# Patient Record
Sex: Female | Born: 1980 | Race: White | Hispanic: No | Marital: Married | Smoking: Never smoker
Health system: Southern US, Community
[De-identification: ages and names within clinical notes are randomized; demographics above are authoritative.]

## PROBLEM LIST (undated history)

## (undated) ENCOUNTER — Inpatient Hospital Stay (HOSPITAL_COMMUNITY): Payer: Self-pay

## (undated) DIAGNOSIS — E282 Polycystic ovarian syndrome: Secondary | ICD-10-CM

## (undated) DIAGNOSIS — O139 Gestational [pregnancy-induced] hypertension without significant proteinuria, unspecified trimester: Secondary | ICD-10-CM

## (undated) DIAGNOSIS — T8130XA Disruption of wound, unspecified, initial encounter: Secondary | ICD-10-CM

---
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
## 2000-09-10 ENCOUNTER — Emergency Department (HOSPITAL_COMMUNITY): Admission: EM | Admit: 2000-09-10 | Discharge: 2000-09-11 | Payer: Self-pay | Admitting: Emergency Medicine

## 2000-09-10 ENCOUNTER — Encounter: Payer: Self-pay | Admitting: Emergency Medicine

## 2000-09-12 ENCOUNTER — Emergency Department (HOSPITAL_COMMUNITY): Admission: EM | Admit: 2000-09-12 | Discharge: 2000-09-12 | Payer: Self-pay | Admitting: Emergency Medicine

## 2000-11-01 ENCOUNTER — Other Ambulatory Visit: Admission: RE | Admit: 2000-11-01 | Discharge: 2000-11-01 | Payer: Self-pay | Admitting: Obstetrics and Gynecology

## 2001-11-13 ENCOUNTER — Other Ambulatory Visit: Admission: RE | Admit: 2001-11-13 | Discharge: 2001-11-13 | Payer: Self-pay | Admitting: Obstetrics and Gynecology

## 2002-11-18 ENCOUNTER — Other Ambulatory Visit: Admission: RE | Admit: 2002-11-18 | Discharge: 2002-11-18 | Payer: Self-pay | Admitting: Obstetrics and Gynecology

## 2003-12-21 ENCOUNTER — Other Ambulatory Visit: Admission: RE | Admit: 2003-12-21 | Discharge: 2003-12-21 | Payer: Self-pay | Admitting: Obstetrics and Gynecology

## 2004-11-17 ENCOUNTER — Other Ambulatory Visit: Admission: RE | Admit: 2004-11-17 | Discharge: 2004-11-17 | Payer: Self-pay | Admitting: Obstetrics and Gynecology

## 2006-01-05 ENCOUNTER — Other Ambulatory Visit: Admission: RE | Admit: 2006-01-05 | Discharge: 2006-01-05 | Payer: Self-pay | Admitting: Obstetrics and Gynecology

## 2009-08-14 ENCOUNTER — Ambulatory Visit (HOSPITAL_COMMUNITY): Admission: RE | Admit: 2009-08-14 | Discharge: 2009-08-14 | Payer: Self-pay | Admitting: Obstetrics and Gynecology

## 2010-07-11 ENCOUNTER — Encounter (INDEPENDENT_AMBULATORY_CARE_PROVIDER_SITE_OTHER): Payer: Self-pay | Admitting: Obstetrics and Gynecology

## 2010-07-11 ENCOUNTER — Inpatient Hospital Stay (HOSPITAL_COMMUNITY)
Admission: RE | Admit: 2010-07-11 | Discharge: 2010-07-13 | Payer: Self-pay | Source: Home / Self Care | Attending: Obstetrics and Gynecology | Admitting: Obstetrics and Gynecology

## 2010-08-04 ENCOUNTER — Inpatient Hospital Stay (HOSPITAL_COMMUNITY)
Admission: AD | Admit: 2010-08-04 | Discharge: 2010-08-04 | Payer: Self-pay | Source: Home / Self Care | Attending: Obstetrics | Admitting: Obstetrics

## 2010-10-17 LAB — WOUND CULTURE

## 2010-10-18 LAB — COMPREHENSIVE METABOLIC PANEL
ALT: 13 U/L (ref 0–35)
AST: 20 U/L (ref 0–37)
Albumin: 2.8 g/dL — ABNORMAL LOW (ref 3.5–5.2)
Alkaline Phosphatase: 121 U/L — ABNORMAL HIGH (ref 39–117)
Chloride: 104 mEq/L (ref 96–112)
GFR calc Af Amer: >60 mL/min (ref >60)
Potassium: 4.1 mEq/L (ref 3.5–5.1)
Sodium: 133 mEq/L — ABNORMAL LOW (ref 135–145)
Total Bilirubin: 0.4 mg/dL (ref 0.3–1.2)

## 2010-10-18 LAB — CBC
HCT: 31.3 % — ABNORMAL LOW (ref 36.0–46.0)
HCT: 39.9 % (ref 36.0–46.0)
Hemoglobin: 10.9 g/dL — ABNORMAL LOW (ref 12.0–15.0)
Hemoglobin: 13.7 g/dL (ref 12.0–15.0)
MCH: 33 pg (ref 26.0–34.0)
MCHC: 34.4 g/dL (ref 30.0–36.0)
MCHC: 34.8 g/dL (ref 30.0–36.0)
MCV: 96 fL (ref 78.0–100.0)
MCV: 97 fL (ref 78.0–100.0)
Platelets: 282 10*3/uL (ref 150–400)
RBC: 4.15 MIL/uL (ref 3.87–5.11)
RDW: 13.3 % (ref 11.5–15.5)
RDW: 14.3 % (ref 11.5–15.5)
WBC: 12.2 10*3/uL — ABNORMAL HIGH (ref 4.0–10.5)

## 2010-10-18 LAB — RPR: RPR Ser Ql: NONREACTIVE

## 2011-07-27 LAB — OB RESULTS CONSOLE RPR: RPR: NONREACTIVE

## 2011-07-27 LAB — OB RESULTS CONSOLE HIV ANTIBODY (ROUTINE TESTING): HIV: NONREACTIVE

## 2011-07-27 LAB — OB RESULTS CONSOLE ABO/RH: RH Type: POSITIVE

## 2011-10-15 ENCOUNTER — Encounter (HOSPITAL_COMMUNITY): Payer: Self-pay | Admitting: *Deleted

## 2011-10-15 ENCOUNTER — Inpatient Hospital Stay (HOSPITAL_COMMUNITY)
Admission: AD | Admit: 2011-10-15 | Discharge: 2011-10-15 | Disposition: A | Discharge status: Home Health | Payer: Commercial Managed Care - PPO | Source: Ambulatory Visit | Attending: Obstetrics & Gynecology | Admitting: Obstetrics & Gynecology

## 2011-10-15 DIAGNOSIS — J069 Acute upper respiratory infection, unspecified: Secondary | ICD-10-CM | POA: Insufficient documentation

## 2011-10-15 DIAGNOSIS — R059 Cough, unspecified: Secondary | ICD-10-CM | POA: Insufficient documentation

## 2011-10-15 DIAGNOSIS — R05 Cough: Secondary | ICD-10-CM | POA: Insufficient documentation

## 2011-10-15 DIAGNOSIS — O99891 Other specified diseases and conditions complicating pregnancy: Secondary | ICD-10-CM | POA: Insufficient documentation

## 2011-10-15 HISTORY — DX: Gestational (pregnancy-induced) hypertension without significant proteinuria, unspecified trimester: O13.9

## 2011-10-15 HISTORY — DX: Disruption of wound, unspecified, initial encounter: T81.30XA

## 2011-10-15 HISTORY — DX: Polycystic ovarian syndrome: E28.2

## 2011-10-15 LAB — URINALYSIS, ROUTINE W REFLEX MICROSCOPIC
Glucose, UA: NEGATIVE mg/dL
Hgb urine dipstick: NEGATIVE
Ketones, ur: NEGATIVE mg/dL
Protein, ur: NEGATIVE mg/dL
pH: 6 (ref 5.0–8.0)

## 2011-10-15 LAB — CBC
HCT: 35.2 % — ABNORMAL LOW (ref 36.0–46.0)
Hemoglobin: 12 g/dL (ref 12.0–15.0)
MCHC: 34.1 g/dL (ref 30.0–36.0)
RBC: 3.76 MIL/uL — ABNORMAL LOW (ref 3.87–5.11)

## 2011-10-15 LAB — URINE MICROSCOPIC-ADD ON

## 2011-10-15 LAB — RAPID STREP SCREEN (MED CTR MEBANE ONLY): Streptococcus, Group A Screen (Direct): NEGATIVE

## 2011-10-15 NOTE — Discharge Instructions (Signed)

## 2011-10-15 NOTE — ED Provider Notes (Signed)
History  Jessee Avers 31 y.o. G2P1001 at 31 wks  HPI:  URTI Sx with congestion, dry cough and feverish sensation x yesterday.  Husband and 1 yo son got a viral infection which just resolved, similar Sx.  FMs pos, no UC, no vag bleed, no fluid leak.      Chief Complaint  Patient presents with  . Fever  . Cough    non-productive     Past Medical History  Diagnosis Date  . Pregnancy induced hypertension   . Wound dehiscence   . PCOS (polycystic ovarian syndrome)     Past Surgical History  Procedure Date  . Cesarean section     History reviewed. No pertinent family history.  History  Substance Use Topics  . Smoking status: Never Smoker   . Smokeless tobacco: Never Used  . Alcohol Use: No    Allergies: No Known Allergies  Prescriptions prior to admission  Medication Sig Dispense Refill  . butalbital-acetaminophen-caffeine (FIORICET, ESGIC) 50-325-40 MG per tablet Take 1-2 tablets by mouth every 6 (six) hours as needed. Patient can take 1-2 tablets every 4-6 hrs PRN with a max dose of 6 tablets per 24 hour period      . metFORMIN (GLUCOPHAGE-XR) 750 MG 24 hr tablet Take 750 mg by mouth daily with breakfast.      . Prenatal Vit-Fe Fumarate-FA (PRENATAL MULTIVITAMIN) TABS Take 1 tablet by mouth daily.        Physical Exam   Blood pressure 113/54, pulse 122, temperature 98.7 F (37.1 C), temperature source Oral, resp. rate 22, height 5\' 7"  (1.702 m), SpO2 99.00%. Lungs clear bilat. Ut soft non-tender FHR 130's, no deceleration No UC   ED Course  Pending Rapid Strep Throat test and U/A  A/P  31 wks with viral URTI.  R/O Strep throat, rapid test pending.  Genia Del MD  10/15/11 at 4:25 pm

## 2012-01-09 ENCOUNTER — Other Ambulatory Visit: Payer: Self-pay | Admitting: Obstetrics and Gynecology

## 2012-01-11 ENCOUNTER — Encounter (HOSPITAL_COMMUNITY): Payer: Self-pay | Admitting: Pharmacist

## 2012-01-25 ENCOUNTER — Encounter (HOSPITAL_COMMUNITY): Payer: Self-pay

## 2012-01-25 ENCOUNTER — Encounter (HOSPITAL_COMMUNITY)
Admission: RE | Admit: 2012-01-25 | Discharge: 2012-01-25 | Disposition: A | Discharge status: Home / Self Care | Payer: Commercial Managed Care - PPO | Source: Ambulatory Visit | Attending: Obstetrics and Gynecology | Admitting: Obstetrics and Gynecology

## 2012-01-25 LAB — CBC
HCT: 34.4 % — ABNORMAL LOW (ref 36.0–46.0)
MCHC: 33.7 g/dL (ref 30.0–36.0)
Platelets: 244 10*3/uL (ref 150–400)
RDW: 13.4 % (ref 11.5–15.5)
WBC: 10.7 10*3/uL — ABNORMAL HIGH (ref 4.0–10.5)

## 2012-01-25 LAB — SURGICAL PCR SCREEN
MRSA, PCR: NEGATIVE
Staphylococcus aureus: NEGATIVE

## 2012-01-25 MED ORDER — CEFAZOLIN SODIUM-DEXTROSE 2-3 GM-% IV SOLR
2.0000 g | INTRAVENOUS | Status: AC
  Administered 2012-01-26: 2 g via INTRAVENOUS
  Filled 2012-01-25: qty 50

## 2012-01-25 NOTE — Pre-Procedure Instructions (Signed)
Shannon from lab called me to say Lab is rejecting tube for T&S drawn today with preop labs.Marland KitchenMarland KitchenMarland KitchenKatie didn't put arm band # on the tube and will need to be re-drawn on DOS.

## 2012-01-25 NOTE — Patient Instructions (Addendum)
YOUR PROCEDURE IS SCHEDULED ON:01/26/12  ENTER THROUGH THE MAIN ENTRANCE OF [facility] AT: 10 am  USE DESK PHONE AND DIAL [phone] TO INFORM us OF YOUR ARRIVAL  CALL [phone] IF YOU HAVE ANY QUESTIONS OR PROBLEMS PRIOR TO YOUR ARRIVAL.  REMEMBER: DO NOT EAT AFTER MIDNIGHT : tonight  SPECIAL INSTRUCTIONS: water ok through the night and up until 730 am on Friday   YOU MAY BRUSH YOUR TEETH THE MORNING OF SURGERY   TAKE THESE MEDICINES THE DAY OF SURGERY WITH SIP OF WATER:none....hold Metformin tonight   DO NOT WEAR JEWELRY, EYE MAKEUP, LIPSTICK OR DARK FINGERNAIL POLISH  DO NOT WEAR LOTIONS   DO NOT SHAVE FOR 48 HOURS PRIOR TO SURGERY

## 2012-01-25 NOTE — H&P (Signed)
Shannon Estrada, Shannon Estrada NO.:  [id number]  MEDICAL RECORD NO.:  [id number]  LOCATION:  PERIO                         FACILITY:  [facility]  PHYSICIAN:  Lenoard Aden, M.D.DATE OF BIRTH:  [birth year]  DATE OF ADMISSION:  12/28/2011 DATE OF DISCHARGE:                             HISTORY & PHYSICAL   HOSPITAL SURGERY:  January 26, 2012.  CHIEF COMPLAINT:  Repeat C-section.  HISTORY OF PRESENT ILLNESS:  She is a 31 year old white female, G2, P1, at [redacted] weeks gestation presents for repeat low-segment transverse cesarean section.  ALLERGIES:  None.  MEDICATIONS:  Fioricet, metformin, and prenatal vitamins.  MEDICAL HISTORY:  Remarkable for chronic hypertension, on medications. Family history of hypertension, heart disease, diabetes, previous C- section in 2011, 9 pounds 4 ounce female.  Prenatal course is uncomplicated.  PHYSICAL EXAMINATION:  GENERAL:  A well-developed well-nourished white female, in no acute distress. HEENT:  Normal. NECK:  Supple.  Full range of motion. LUNGS:  Clear. HEART:  Regular rate and rhythm. ABDOMEN:  Soft, gravid, nontender.  Estimated fetal weight 8 to 9 pounds.  Cervix is closed, 20%, vertex, -2. EXTREMITIES:  There are no cords. NEUROLOGIC:  Nonfocal. SKIN:  Intact.  IMPRESSION:  Term intrauterine pregnancy with previous C-section, for elective repeat.  PLAN:  Proceed with repeat C-section.  Risks of anesthesia, infection, bleeding, injury to abdominal organs, need for repair discussed. Delayed versus immediate complications to include bowel and bladder injury noted.  The patient acknowledges and wishes to proceed.    Lenoard Aden, M.D.    RJT/MEDQ  D:  01/25/2012  T:  01/25/2012  Job:  [id number]

## 2012-01-26 ENCOUNTER — Encounter (HOSPITAL_COMMUNITY): Payer: Self-pay | Admitting: Anesthesiology

## 2012-01-26 ENCOUNTER — Inpatient Hospital Stay (HOSPITAL_COMMUNITY)
Admission: RE | Admit: 2012-01-26 | Discharge: 2012-01-28 | DRG: 765 | Disposition: A | Discharge status: Home / Self Care | Payer: Commercial Managed Care - PPO | Source: Ambulatory Visit | Attending: Obstetrics and Gynecology | Admitting: Obstetrics and Gynecology

## 2012-01-26 ENCOUNTER — Encounter (HOSPITAL_COMMUNITY): Payer: Self-pay | Admitting: General Surgery

## 2012-01-26 ENCOUNTER — Encounter (HOSPITAL_COMMUNITY): Payer: Self-pay

## 2012-01-26 ENCOUNTER — Inpatient Hospital Stay (HOSPITAL_COMMUNITY): Payer: Commercial Managed Care - PPO

## 2012-01-26 ENCOUNTER — Encounter (HOSPITAL_COMMUNITY)
Admission: RE | Disposition: A | Discharge status: Home / Self Care | Payer: Self-pay | Source: Ambulatory Visit | Attending: Obstetrics and Gynecology

## 2012-01-26 DIAGNOSIS — O3660X0 Maternal care for excessive fetal growth, unspecified trimester, not applicable or unspecified: Secondary | ICD-10-CM | POA: Diagnosis present

## 2012-01-26 DIAGNOSIS — O34599 Maternal care for other abnormalities of gravid uterus, unspecified trimester: Secondary | ICD-10-CM | POA: Diagnosis present

## 2012-01-26 DIAGNOSIS — Z01812 Encounter for preprocedural laboratory examination: Secondary | ICD-10-CM

## 2012-01-26 DIAGNOSIS — E282 Polycystic ovarian syndrome: Secondary | ICD-10-CM | POA: Diagnosis present

## 2012-01-26 DIAGNOSIS — Z01818 Encounter for other preprocedural examination: Secondary | ICD-10-CM

## 2012-01-26 DIAGNOSIS — O34219 Maternal care for unspecified type scar from previous cesarean delivery: Principal | ICD-10-CM | POA: Diagnosis present

## 2012-01-26 DIAGNOSIS — O1002 Pre-existing essential hypertension complicating childbirth: Secondary | ICD-10-CM | POA: Diagnosis present

## 2012-01-26 LAB — TYPE AND SCREEN: Antibody Screen: NEGATIVE

## 2012-01-26 LAB — RPR: RPR Ser Ql: NONREACTIVE

## 2012-01-26 SURGERY — Surgical Case
Anesthesia: Spinal | Site: Abdomen | Wound class: Clean Contaminated

## 2012-01-26 MED ORDER — PRENATAL MULTIVITAMIN CH
1.0000 | ORAL_TABLET | Freq: Every day | ORAL | Status: DC
  Administered 2012-01-27: 1 via ORAL
  Filled 2012-01-26: qty 1

## 2012-01-26 MED ORDER — DIBUCAINE 1 % RE OINT: 1.0000 "application " | TOPICAL_OINTMENT | RECTAL | Status: DC | PRN

## 2012-01-26 MED ORDER — FENTANYL CITRATE 0.05 MG/ML IJ SOLN
INTRAMUSCULAR | Status: DC | PRN
  Administered 2012-01-26: 25 ug via INTRATHECAL

## 2012-01-26 MED ORDER — PHENYLEPHRINE 40 MCG/ML (10ML) SYRINGE FOR IV PUSH (FOR BLOOD PRESSURE SUPPORT)
PREFILLED_SYRINGE | INTRAVENOUS | Status: AC
  Filled 2012-01-26: qty 5

## 2012-01-26 MED ORDER — FENTANYL CITRATE 0.05 MG/ML IJ SOLN
INTRAMUSCULAR | Status: AC
  Filled 2012-01-26: qty 2

## 2012-01-26 MED ORDER — WITCH HAZEL-GLYCERIN EX PADS: 1.0000 "application " | MEDICATED_PAD | CUTANEOUS | Status: DC | PRN

## 2012-01-26 MED ORDER — ONDANSETRON HCL 4 MG/2ML IJ SOLN: 4.0000 mg | Freq: Three times a day (TID) | INTRAMUSCULAR | Status: DC | PRN

## 2012-01-26 MED ORDER — NALOXONE HCL 0.4 MG/ML IJ SOLN
1.0000 ug/kg/h | INTRAMUSCULAR | Status: DC | PRN
  Filled 2012-01-26: qty 2.5

## 2012-01-26 MED ORDER — OXYCODONE-ACETAMINOPHEN 5-325 MG PO TABS
1.0000 | ORAL_TABLET | ORAL | Status: DC | PRN
  Administered 2012-01-28: 1 via ORAL
  Filled 2012-01-26: qty 1

## 2012-01-26 MED ORDER — METHYLERGONOVINE MALEATE 0.2 MG/ML IJ SOLN: 0.2000 mg | INTRAMUSCULAR | Status: DC | PRN

## 2012-01-26 MED ORDER — NALBUPHINE HCL 10 MG/ML IJ SOLN
5.0000 mg | INTRAMUSCULAR | Status: DC | PRN
  Filled 2012-01-26: qty 1

## 2012-01-26 MED ORDER — LANOLIN HYDROUS EX OINT: 1.0000 "application " | TOPICAL_OINTMENT | CUTANEOUS | Status: DC | PRN

## 2012-01-26 MED ORDER — LACTATED RINGERS IV SOLN
INTRAVENOUS | Status: DC
  Administered 2012-01-26: 20:00:00 via INTRAVENOUS

## 2012-01-26 MED ORDER — METFORMIN HCL ER 750 MG PO TB24
750.0000 mg | ORAL_TABLET | Freq: Every day | ORAL | Status: DC
  Administered 2012-01-27: 750 mg via ORAL
  Filled 2012-01-26 (×2): qty 1

## 2012-01-26 MED ORDER — EPHEDRINE 5 MG/ML INJ
INTRAVENOUS | Status: AC
  Filled 2012-01-26: qty 10

## 2012-01-26 MED ORDER — MORPHINE SULFATE 0.5 MG/ML IJ SOLN
INTRAMUSCULAR | Status: AC
  Filled 2012-01-26: qty 10

## 2012-01-26 MED ORDER — IBUPROFEN 600 MG PO TABS
600.0000 mg | ORAL_TABLET | Freq: Four times a day (QID) | ORAL | Status: DC
  Administered 2012-01-26 – 2012-01-28 (×6): 600 mg via ORAL
  Filled 2012-01-26: qty 1

## 2012-01-26 MED ORDER — ONDANSETRON HCL 4 MG/2ML IJ SOLN
INTRAMUSCULAR | Status: DC | PRN
  Administered 2012-01-26: 4 mg via INTRAVENOUS

## 2012-01-26 MED ORDER — SIMETHICONE 80 MG PO CHEW
80.0000 mg | CHEWABLE_TABLET | ORAL | Status: DC | PRN
  Administered 2012-01-27: 80 mg via ORAL

## 2012-01-26 MED ORDER — SCOPOLAMINE 1 MG/3DAYS TD PT72: 1.0000 | MEDICATED_PATCH | Freq: Once | TRANSDERMAL | Status: DC

## 2012-01-26 MED ORDER — METOCLOPRAMIDE HCL 5 MG/ML IJ SOLN: 10.0000 mg | Freq: Three times a day (TID) | INTRAMUSCULAR | Status: DC | PRN

## 2012-01-26 MED ORDER — NALOXONE HCL 0.4 MG/ML IJ SOLN: 0.4000 mg | INTRAMUSCULAR | Status: DC | PRN

## 2012-01-26 MED ORDER — OXYTOCIN 10 UNIT/ML IJ SOLN
INTRAMUSCULAR | Status: AC
  Filled 2012-01-26: qty 4

## 2012-01-26 MED ORDER — MORPHINE SULFATE (PF) 0.5 MG/ML IJ SOLN
INTRAMUSCULAR | Status: DC | PRN
  Administered 2012-01-26: .15 mg via INTRATHECAL

## 2012-01-26 MED ORDER — MEPERIDINE HCL 25 MG/ML IJ SOLN: 6.2500 mg | INTRAMUSCULAR | Status: DC | PRN

## 2012-01-26 MED ORDER — ONDANSETRON HCL 4 MG/2ML IJ SOLN
INTRAMUSCULAR | Status: AC
  Filled 2012-01-26: qty 2

## 2012-01-26 MED ORDER — LACTATED RINGERS IV SOLN
INTRAVENOUS | Status: DC
  Administered 2012-01-26 (×4): via INTRAVENOUS

## 2012-01-26 MED ORDER — PHENYLEPHRINE HCL 10 MG/ML IJ SOLN
INTRAMUSCULAR | Status: DC | PRN
  Administered 2012-01-26: 40 ug via INTRAVENOUS
  Administered 2012-01-26: 80 ug via INTRAVENOUS
  Administered 2012-01-26: 40 ug via INTRAVENOUS
  Administered 2012-01-26: 80 ug via INTRAVENOUS

## 2012-01-26 MED ORDER — IBUPROFEN 600 MG PO TABS
600.0000 mg | ORAL_TABLET | Freq: Four times a day (QID) | ORAL | Status: DC | PRN
  Filled 2012-01-26 (×5): qty 1

## 2012-01-26 MED ORDER — SCOPOLAMINE 1 MG/3DAYS TD PT72
MEDICATED_PATCH | TRANSDERMAL | Status: AC
  Administered 2012-01-26: 1.5 mg via TRANSDERMAL
  Filled 2012-01-26: qty 1

## 2012-01-26 MED ORDER — OXYTOCIN 10 UNIT/ML IJ SOLN
40.0000 [IU] | INTRAVENOUS | Status: DC | PRN
  Administered 2012-01-26: 40 [IU] via INTRAVENOUS

## 2012-01-26 MED ORDER — TETANUS-DIPHTH-ACELL PERTUSSIS 5-2.5-18.5 LF-MCG/0.5 IM SUSP: 0.5000 mL | Freq: Once | INTRAMUSCULAR | Status: DC

## 2012-01-26 MED ORDER — BUPIVACAINE HCL (PF) 0.25 % IJ SOLN
INTRAMUSCULAR | Status: AC
  Filled 2012-01-26: qty 30

## 2012-01-26 MED ORDER — KETOROLAC TROMETHAMINE 30 MG/ML IJ SOLN: 30.0000 mg | Freq: Four times a day (QID) | INTRAMUSCULAR | Status: DC | PRN

## 2012-01-26 MED ORDER — DIPHENHYDRAMINE HCL 25 MG PO CAPS
25.0000 mg | ORAL_CAPSULE | ORAL | Status: DC | PRN
  Filled 2012-01-26: qty 1

## 2012-01-26 MED ORDER — DIPHENHYDRAMINE HCL 25 MG PO CAPS: 25.0000 mg | ORAL_CAPSULE | Freq: Four times a day (QID) | ORAL | Status: DC | PRN

## 2012-01-26 MED ORDER — ONDANSETRON HCL 4 MG PO TABS: 4.0000 mg | ORAL_TABLET | ORAL | Status: DC | PRN

## 2012-01-26 MED ORDER — ZOLPIDEM TARTRATE 5 MG PO TABS: 5.0000 mg | ORAL_TABLET | Freq: Every evening | ORAL | Status: DC | PRN

## 2012-01-26 MED ORDER — KETOROLAC TROMETHAMINE 60 MG/2ML IM SOLN
60.0000 mg | Freq: Once | INTRAMUSCULAR | Status: AC | PRN
  Administered 2012-01-26: 60 mg via INTRAMUSCULAR

## 2012-01-26 MED ORDER — DIPHENHYDRAMINE HCL 50 MG/ML IJ SOLN: 25.0000 mg | INTRAMUSCULAR | Status: DC | PRN

## 2012-01-26 MED ORDER — EPHEDRINE SULFATE 50 MG/ML IJ SOLN
INTRAMUSCULAR | Status: DC | PRN
  Administered 2012-01-26 (×5): 10 mg via INTRAVENOUS

## 2012-01-26 MED ORDER — SIMETHICONE 80 MG PO CHEW
80.0000 mg | CHEWABLE_TABLET | Freq: Three times a day (TID) | ORAL | Status: DC
  Administered 2012-01-26 – 2012-01-28 (×5): 80 mg via ORAL

## 2012-01-26 MED ORDER — KETOROLAC TROMETHAMINE 60 MG/2ML IM SOLN
INTRAMUSCULAR | Status: AC
  Administered 2012-01-26: 60 mg via INTRAMUSCULAR
  Filled 2012-01-26: qty 2

## 2012-01-26 MED ORDER — FENTANYL CITRATE 0.05 MG/ML IJ SOLN: 25.0000 ug | INTRAMUSCULAR | Status: DC | PRN

## 2012-01-26 MED ORDER — MENTHOL 3 MG MT LOZG: 1.0000 | LOZENGE | OROMUCOSAL | Status: DC | PRN

## 2012-01-26 MED ORDER — ONDANSETRON HCL 4 MG/2ML IJ SOLN: 4.0000 mg | INTRAMUSCULAR | Status: DC | PRN

## 2012-01-26 MED ORDER — SODIUM CHLORIDE 0.9 % IJ SOLN: 3.0000 mL | INTRAMUSCULAR | Status: DC | PRN

## 2012-01-26 MED ORDER — SENNOSIDES-DOCUSATE SODIUM 8.6-50 MG PO TABS
2.0000 | ORAL_TABLET | Freq: Every day | ORAL | Status: DC
  Administered 2012-01-26 – 2012-01-27 (×2): 2 via ORAL

## 2012-01-26 MED ORDER — DIPHENHYDRAMINE HCL 50 MG/ML IJ SOLN: 12.5000 mg | INTRAMUSCULAR | Status: DC | PRN

## 2012-01-26 MED ORDER — SCOPOLAMINE 1 MG/3DAYS TD PT72
1.0000 | MEDICATED_PATCH | Freq: Once | TRANSDERMAL | Status: DC
  Administered 2012-01-26: 1.5 mg via TRANSDERMAL

## 2012-01-26 MED ORDER — METHYLERGONOVINE MALEATE 0.2 MG PO TABS: 0.2000 mg | ORAL_TABLET | ORAL | Status: DC | PRN

## 2012-01-26 MED ORDER — BUPIVACAINE HCL (PF) 0.25 % IJ SOLN
INTRAMUSCULAR | Status: DC | PRN
  Administered 2012-01-26: 10 mL

## 2012-01-26 MED ORDER — OXYTOCIN 40 UNITS IN LACTATED RINGERS INFUSION - SIMPLE MED: 62.5000 mL/h | INTRAVENOUS | Status: AC

## 2012-01-26 SURGICAL SUPPLY — 23 items
CLOTH BEACON ORANGE TIMEOUT ST (SAFETY) ×2 IMPLANT
DRESSING TELFA 8X3 (GAUZE/BANDAGES/DRESSINGS) ×2 IMPLANT
ELECT REM PT RETURN 9FT ADLT (ELECTROSURGICAL) ×2
ELECTRODE REM PT RTRN 9FT ADLT (ELECTROSURGICAL) ×1 IMPLANT
EXTRACTOR VACUUM M CUP 4 TUBE (SUCTIONS) ×2 IMPLANT
GAUZE SPONGE 4X4 12PLY STRL LF (GAUZE/BANDAGES/DRESSINGS) ×4 IMPLANT
GLOVE BIO SURGEON STRL SZ7.5 (GLOVE) ×4 IMPLANT
GOWN PREVENTION PLUS LG XLONG (DISPOSABLE) ×4 IMPLANT
GOWN PREVENTION PLUS XLARGE (GOWN DISPOSABLE) ×2 IMPLANT
NEEDLE HYPO 25X1 1.5 SAFETY (NEEDLE) ×2 IMPLANT
NS IRRIG 1000ML POUR BTL (IV SOLUTION) ×2 IMPLANT
PACK C SECTION WH (CUSTOM PROCEDURE TRAY) ×2 IMPLANT
PAD ABD 7.5X8 STRL (GAUZE/BANDAGES/DRESSINGS) ×2 IMPLANT
STAPLER VISISTAT 35W (STAPLE) ×2 IMPLANT
SUT MNCRL 0 VIOLET CTX 36 (SUTURE) ×2 IMPLANT
SUT MON AB 2-0 CT1 27 (SUTURE) ×2 IMPLANT
SUT MON AB-0 CT1 36 (SUTURE) ×4 IMPLANT
SUT MONOCRYL 0 CTX 36 (SUTURE) ×2
SUT PLAIN 2 0 XLH (SUTURE) ×2 IMPLANT
SYR CONTROL 10ML LL (SYRINGE) ×2 IMPLANT
TAPE CLOTH SURG 4X10 WHT LF (GAUZE/BANDAGES/DRESSINGS) ×2 IMPLANT
TOWEL OR 17X24 6PK STRL BLUE (TOWEL DISPOSABLE) ×4 IMPLANT
TRAY FOLEY CATH 14FR (SET/KITS/TRAYS/PACK) ×2 IMPLANT

## 2012-01-26 NOTE — Progress Notes (Signed)
Patient ID: Shannon Estrada, female   DOB: 02-18-1981, 31 y.o.   MRN: [id number] Patient seen and examined. Consent witnessed and signed. No changes noted. Update completed.

## 2012-01-26 NOTE — Transfer of Care (Signed)
Immediate Anesthesia Transfer of Care Note  Patient: Shannon Estrada  Procedure(s) Performed: Procedure(s) (LRB): CESAREAN SECTION (N/A)  Patient Location: PACU  Anesthesia Type: Spinal  Level of Consciousness: awake, alert  and patient cooperative  Airway & Oxygen Therapy: Patient Spontanous Breathing  Post-op Assessment: Report given to PACU RN  Post vital signs: Reviewed  Complications: No apparent anesthesia complications

## 2012-01-26 NOTE — Anesthesia Procedure Notes (Signed)
Spinal  Patient location during procedure: OR Preanesthetic Checklist Completed: patient identified, site marked, surgical consent, pre-op evaluation, timeout performed, IV checked, risks and benefits discussed and monitors and equipment checked Spinal Block Patient position: sitting Prep: DuraPrep Patient monitoring: heart rate, cardiac monitor, continuous pulse ox and blood pressure Approach: midline Location: L3-4 Injection technique: single-shot Needle Needle type: Sprotte  Needle gauge: 24 G Needle length: 9 cm Assessment Sensory level: T4 Additional Notes Spinal Dosage in OR  Bupivicaine ml       1.7 PFMS04   mcg        150 Fentanyl mcg            25    

## 2012-01-26 NOTE — Preoperative (Signed)
Beta Blockers   Reason not to administer Beta Blockers:Not Applicable 

## 2012-01-26 NOTE — Anesthesia Postprocedure Evaluation (Signed)
  Anesthesia Post-op Note  Patient: Shannon Estrada  Procedure(s) Performed: Procedure(s) (LRB): CESAREAN SECTION (N/A)  Patient is awake, responsive, moving her legs, and has signs of resolution of her numbness. Pain and nausea are reasonably well controlled. Vital signs are stable and clinically acceptable. Oxygen saturation is clinically acceptable. There are no apparent anesthetic complications at this time. Patient is ready for discharge.

## 2012-01-26 NOTE — Anesthesia Preprocedure Evaluation (Signed)
Anesthesia Evaluation  Patient identified by MRN, date of birth, ID band Patient awake    Reviewed: Allergy & Precautions, H&P , NPO status , Patient's Chart, lab work & pertinent test results, reviewed documented beta blocker date and time   History of Anesthesia Complications Negative for: history of anesthetic complications  Airway Mallampati: I TM Distance: >3 FB Neck ROM: full    Dental  (+) Teeth Intact   Pulmonary neg pulmonary ROS,  breath sounds clear to auscultation        Cardiovascular negative cardio ROS  Rhythm:regular Rate:Normal     Neuro/Psych negative neurological ROS  negative psych ROS   GI/Hepatic negative GI ROS, Neg liver ROS,   Endo/Other  Morbid obesityPCOS on metformin  Renal/GU negative Renal ROS  negative genitourinary   Musculoskeletal   Abdominal   Peds  Hematology negative hematology ROS (+)   Anesthesia Other Findings   Reproductive/Obstetrics (+) Pregnancy (h/o c/s x1, repeat today)                           Anesthesia Physical Anesthesia Plan  ASA: III  Anesthesia Plan: Spinal   Post-op Pain Management:    Induction:   Airway Management Planned:   Additional Equipment:   Intra-op Plan:   Post-operative Plan:   Informed Consent: I have reviewed the patients History and Physical, chart, labs and discussed the procedure including the risks, benefits and alternatives for the proposed anesthesia with the patient or authorized representative who has indicated his/her understanding and acceptance.     Plan Discussed with: Surgeon and CRNA  Anesthesia Plan Comments:         Anesthesia Quick Evaluation

## 2012-01-26 NOTE — Op Note (Signed)
Cesarean Section Procedure Note  Indications: previous uterine incision kerr x one  Pre-operative Diagnosis: 39 week 0 day pregnancy.  Post-operative Diagnosis: same  Surgeon: Lenoard Aden   Assistants: none  Anesthesia: Spinal anesthesia  ASA Class: 2  Procedure Details  The patient was seen in the Holding Room. The risks, benefits, complications, treatment options, and expected outcomes were discussed with the patient.  The patient concurred with the proposed plan, giving informed consent. The risks of anesthesia, infection, bleeding and possible injury to other organs discussed. Injury to bowel, bladder, or ureter with possible need for repair discussed. Possible need for transfusion with secondary risks of hepatitis or HIV acquisition discussed. Post operative complications to include but not limited to DVT, PE and Pneumonia noted. The site of surgery properly noted/marked. The patient was taken to Operating Room # 9, identified as Shannon Estrada and the procedure verified as C-Section Delivery. A Time Out was held and the above information confirmed.  After induction of anesthesia, the patient was draped and prepped in the usual sterile manner. A Pfannenstiel incision was made and carried down through the subcutaneous tissue to the fascia. Fascial incision was made and extended transversely using Mayo scissors. The fascia was separated from the underlying rectus tissue superiorly and inferiorly. The peritoneum was identified and entered. Peritoneal incision was extended longitudinally. The utero-vesical peritoneal reflection was incised transversely and the bladder flap was bluntly freed from the lower uterine segment. A low transverse uterine incision(Kerr hysterotomy) was made. Delivered from OA presentation with vacuum assistance x one was a  female with Apgar scores of 8 at one minute and 9 at five minutes. Bulb suctioning gently performed. Neonatal team in attendance.After the  umbilical cord was clamped and cut cord blood was obtained for evaluation. The placenta was removed intact and appeared normal. The uterus was curetted with a dry lap pack. Good hemostasis was noted.The uterine outline, tubes and ovaries appeared normal. The uterine incision was closed with running locked sutures of 0 Monocryl x 2 layers. Hemostasis was observed. Lavage was carried out until clear.The parietal peritoneum was closed with a running 2-0 Monocryl suture. The fascia was then reapproximated with running sutures of 0 Monocryl. The skin was reapproximated with staples.  Instrument, sponge, and needle counts were correct prior the abdominal closure and at the conclusion of the case.   Findings: Nl uterus , tubes and ovaries.  Estimated Blood Loss:  300 mL         Drains: foley                 Specimens: placenta                 Complications:  None; patient tolerated the procedure well.         Disposition: PACU - hemodynamically stable.         Condition: stable  Attending Attestation: I performed the procedure.

## 2012-01-26 NOTE — Consult Note (Signed)
Neonatology Note:   Attendance at C-section:    I was asked to attend this repeat C/S at term. The mother is a G2P1 O pos, GBS neg with an uncomplicated pregnancy. ROM at delivery, fluid clear. Infant vigorous with good spontaneous cry and tone. Needed only minimal bulb suctioning. Ap 9/9. Lungs clear to ausc in DR. To CN to care of Pediatrician.   Burnell Matlin, MD 

## 2012-01-27 LAB — CBC
HCT: 34 % — ABNORMAL LOW (ref 36.0–46.0)
Hemoglobin: 11.5 g/dL — ABNORMAL LOW (ref 12.0–15.0)
MCV: 90.7 fL (ref 78.0–100.0)
RBC: 3.75 MIL/uL — ABNORMAL LOW (ref 3.87–5.11)
WBC: 13.4 10*3/uL — ABNORMAL HIGH (ref 4.0–10.5)

## 2012-01-27 NOTE — Addendum Note (Signed)
Addendum  created 01/27/12 0836 by Christene Lye, CRNA   Modules edited:Charges VN, Notes Section

## 2012-01-27 NOTE — Anesthesia Postprocedure Evaluation (Signed)
  Anesthesia Post-op Note  Patient: Shannon Estrada  Procedure(s) Performed: Procedure(s) (LRB): CESAREAN SECTION (N/A)  Patient Location: Mother/Baby  Anesthesia Type: Epidural  Level of Consciousness: awake, alert  and oriented  Airway and Oxygen Therapy: Patient Spontanous Breathing and Patient connected to nasal cannula oxygen  Post-op Pain: none  Post-op Assessment: Post-op Vital signs reviewed, Patient's Cardiovascular Status Stable, No headache, No backache, No residual numbness and No residual motor weakness  Post-op Vital Signs: Reviewed and stable  Complications: No apparent anesthesia complications

## 2012-01-27 NOTE — Progress Notes (Addendum)
Patient ID: Shannon Estrada, female   DOB: 12/16/1980, 31 y.o.   MRN: [id number]  POSTOPERATIVE DAY # 1 S/P cesarean section  S:         Reports feeling well - some nausea yesterday -none today             Tolerating po intake / no nausea / no vomiting / + flatus / no BM             Bleeding is light             Pain controlled with long acting narcotic and started motrin today             Up ad lib / ambulatory  Newborn breast feeding  / female newborn  O:  A & O x 3 NAD             VS: Blood pressure 99/64, pulse 66, temperature 98.1 F (36.7 C), temperature source Oral, resp. rate 18, height 5\' 7"  (1.702 m), weight 112.038 kg (247 lb), SpO2 94.00%, unknown if currently breastfeeding.  LABS: WBC/Hgb/Hct/Plts:  13.4/11.5/34.0/237 (06/22 0554)   I&O:    + 1650  Lungs: Clear and unlabored  Heart: regular rate and rhythm / no mumurs  Abdomen: soft, non-tender, non-distended, active BS             Fundus: firm, non-tender, U-1             Dressing: intact, no drainage             Perineum: intact without edema  Lochia: light  Extremities: no edema, no calf pain or tenderness  A:        POD # 1 S/P cesarean section            PCOS - stable on metformin throughout pregnancy   P:        Routine postoperative care - advance activity / remove dressing in shower             Continue Metformin postpartum             Considering early discharge tomorrow   [name] CNM,MSN 01/27/2012, 8:28 AM

## 2012-01-27 NOTE — Progress Notes (Signed)
[name] CNM notified at Pt.s request for staple removal. Pt. Complains of sharp pain in RLQ   due to misplaced staple. Order received to remove 1 staple and apply steri steri strip. Tolerated Procedure well

## 2012-01-28 MED ORDER — OXYCODONE-ACETAMINOPHEN 5-325 MG PO TABS: 1.0000 | ORAL_TABLET | ORAL | Status: AC | PRN

## 2012-01-28 MED ORDER — IBUPROFEN 600 MG PO TABS: 600.0000 mg | ORAL_TABLET | Freq: Four times a day (QID) | ORAL | Status: AC

## 2012-01-28 NOTE — Progress Notes (Signed)
Patient ID: Shannon Estrada, female   DOB: 1981/03/19, 31 y.o.   MRN: [id number]  POSTOPERATIVE DAY # 2 S/P cesarean section   S:         Reports feeling well-  Wants discharge this am             Tolerating po intake / no nausea / no vomiting / + flatus / no BM             Bleeding is light             Pain controlled with motrin and percocet             Up ad lib / ambulatory  Newborn breast feeding     O:  A & O x 3              VS: Blood pressure 128/76, pulse 71, temperature 97.7 F (36.5 C), temperature source Oral, resp. rate 18, height 5\' 7"  (1.702 m), weight 112.038 kg (247 lb), SpO2 98.00%, unknown if currently breastfeeding.  Lungs: Clear and unlabored  Heart: regular rate and rhythm   Abdomen: soft, non-tender, non-distended, active BS             Fundus: firm, non-tender, U-1             Dressing OFF              Incision:  approximated with staples / no erythema / no ecchymosis / no drainage  Perineum: no edema  Lochia: light  Extremities: trace edema, no calf pain or tenderness, negative Homans  A:        POD # 2 S/P cesarean section             P:        Routine postoperative care              Discharge home             Staple removal at [facility] in 2-3 days              [facility] discharge instructions - [facility] booklet     [name] SNM 01/28/2012, 7:45 AM

## 2012-01-28 NOTE — Discharge Summary (Signed)
POSTOPERATIVE DISCHARGE SUMMARY:  Patient ID: Shannon Estrada MRN: [id number] DOB/AGE: [birth year] 31 y.o.  Admit date: 01/26/2012 Discharge date:  01/28/2012  Admission Diagnoses: [redacted] weeks gestation with previous CS - elective repeat PCOS - altered glucose metabolism LGA  Discharge Diagnoses:   Term Pregnancy-delivered  POD 2 cesarean section  Prenatal history: G2P2002   EDC : 02/02/2012, Alternate EDD Entry  [facility] at [facility]  Primary provider : Taavon Prenatal course complicated by PCOS / LGA / previous CS / hx PIH  Prenatal Labs: ABO, Rh: O (12/20 0000) positive Antibody: NEG (06/21 1019) Rubella: Immune (12/20 0000)  RPR: NON REACTIVE (06/20 1416)  HBsAg: Negative (12/20 0000)  HIV: Non-reactive (12/20 0000)  1 hr Glucola : glucose intolerance with PCOS   Medical / Surgical History :  Past medical history:  Past Medical History  Diagnosis Date  . Pregnancy induced hypertension   . Wound dehiscence   . PCOS (polycystic ovarian syndrome)     Past surgical history:  Past Surgical History  Procedure Date  . Cesarean section     Family History: No family history on file.  Social History:  reports that she has never smoked. She has never used smokeless tobacco. She reports that she does not drink alcohol or use illicit drugs.   Allergies: Review of patient's allergies indicates no known allergies.    Current Medications at time of admission:  Prior to Admission medications   Medication Sig Start Date End Date Taking? Authorizing Provider  metFORMIN (GLUCOPHAGE-XR) 750 MG 24 hr tablet Take 750 mg by mouth daily with breakfast.   Yes Historical Provider, MD  Prenatal Vit-Fe Fumarate-FA (PRENATAL MULTIVITAMIN) TABS Take 1 tablet by mouth daily.   Yes Historical Provider, MD  butalbital-acetaminophen-caffeine (FIORICET, ESGIC) 50-325-40 MG per tablet Take 1-2 tablets by mouth every 6 (six) hours as needed. Patient can take 1-2 tablets  every 4-6 hrs PRN with a max dose of 6 tablets per 24 hour period    Historical Provider, MD    Procedures: Cesarean section delivery of female newborn by Dr Billy Coast  See operative report for further details  Postoperative / postpartum course: uneventful - discharged home POD 2  Physical Exam:   VSS: Blood pressure 128/76, pulse 71, temperature 97.7 F (36.5 C), temperature source Oral, resp. rate 18, height 5\' 7"  (1.702 m), weight 112.038 kg (247 lb), SpO2 98.00%, unknown if currently breastfeeding.  LABS:   preop hgb 11.6  / postop hgb  11.5 General: NAD / calm Heart:RR Lungs:Clear Abdomen:active BS / soft and non-distended / non-tender Extremities:no edema   Incision:  approximated with staples / no erythema / no ecchymosis / no drainage Staples: intact for removal day 4-5  Discharge Instructions:  Discharged Condition: stable Activity: pelvic rest and postoperative restrictions x 2 weeks Diet: routine Medications: see below Medication List  As of 01/28/2012  7:48 AM   TAKE these medications         butalbital-acetaminophen-caffeine 50-325-40 MG per tablet   Commonly known as: FIORICET, ESGIC   Take 1-2 tablets by mouth every 6 (six) hours as needed. Patient can take 1-2 tablets every 4-6 hrs PRN with a max dose of 6 tablets per 24 hour period      ibuprofen 600 MG tablet   Commonly known as: ADVIL,MOTRIN   Take 1 tablet (600 mg total) by mouth every 6 (six) hours.      metFORMIN 750 MG 24 hr tablet   Commonly known  as: GLUCOPHAGE-XR   Take 750 mg by mouth daily with breakfast.      oxyCODONE-acetaminophen 5-325 MG per tablet   Commonly known as: PERCOCET   Take 1 tablet by mouth every 4 (four) hours as needed (moderate - severe pain).      prenatal multivitamin Tabs   Take 1 tablet by mouth daily.           Condition: stable Postpartum Instructions: refer to practice specific booklet Discharge to: home Disposition: 06-Home-Health Care Svc Follow up :    Follow-up Information    Follow up with Lenoard Aden, MD. Schedule an appointment as soon as possible for a visit in 6 weeks. (staple removal at [facility] in 2-3 days - call for apt)    Contact information:   [address] [place] [address] [phone]           Signed: [name] [facility] 01/28/2012, 7:48 AM

## 2012-01-29 ENCOUNTER — Encounter (HOSPITAL_COMMUNITY): Payer: Self-pay | Admitting: Obstetrics and Gynecology

## 2014-06-08 ENCOUNTER — Encounter (HOSPITAL_COMMUNITY): Payer: Self-pay | Admitting: Obstetrics and Gynecology

## 2014-07-03 ENCOUNTER — Ambulatory Visit (INDEPENDENT_AMBULATORY_CARE_PROVIDER_SITE_OTHER): Payer: 59 | Admitting: Emergency Medicine

## 2014-07-03 VITALS — BP 112/68 | HR 95 | Temp 98.0°F | Resp 16 | Ht 67.0 in | Wt 197.4 lb

## 2014-07-03 DIAGNOSIS — S335XXA Sprain of ligaments of lumbar spine, initial encounter: Secondary | ICD-10-CM

## 2014-07-03 MED ORDER — CYCLOBENZAPRINE HCL 10 MG PO TABS: 10.0000 mg | ORAL_TABLET | Freq: Three times a day (TID) | ORAL | Status: AC | PRN

## 2014-07-03 MED ORDER — ACETAMINOPHEN-CODEINE #3 300-30 MG PO TABS: 1.0000 | ORAL_TABLET | ORAL | Status: AC | PRN

## 2014-07-03 MED ORDER — NAPROXEN SODIUM 550 MG PO TABS: 550.0000 mg | ORAL_TABLET | Freq: Two times a day (BID) | ORAL | Status: AC

## 2014-07-03 NOTE — Progress Notes (Signed)
[facility] [address], [place] [address] [phone]  Date:  07/03/2014   Name:  Shannon AversJeanette R Matsushima   DOB:  01/19/1981   MRN:  [id number]  PCP:  Lillia MountainGRIFFIN,JOHN JOSEPH, MD    Chief Complaint: Back Pain   History of Present Illness:  Shannon AversJeanette R Collett is a 33 y.o. very pleasant female patient who presents with the following:  Patient has a history of a "bad back".  Yesterday lifted her two children and dog into the car and developed back pain. Says is a "white pain" Radiates around waist and down her legs to above the knees. No weakness or neuro symptoms. Interfered with sleep. No improvement with over the counter medications or other home remedies.  Denies other complaint or health concern today.   Patient Active Problem List   Diagnosis Date Noted  . Postpartum care following cesarean delivery (6/21) 01/26/2012    Past Medical History  Diagnosis Date  . Pregnancy induced hypertension   . Wound dehiscence   . PCOS (polycystic ovarian syndrome)     Past Surgical History  Procedure Laterality Date  . Cesarean section    . Cesarean section  01/26/2012    Procedure: CESAREAN SECTION;  Surgeon: Lenoard Adenichard J Taavon, MD;  Location: [facility];  Service: Gynecology;  Laterality: N/A;  EDD: 02/02/12    History  Substance Use Topics  . Smoking status: Never Smoker   . Smokeless tobacco: Never Used  . Alcohol Use: No    Family History  Problem Relation Age of Onset  . Heart disease Father     No Known Allergies  Medication list has been reviewed and updated.  Current Outpatient Prescriptions on File Prior to Visit  Medication Sig Dispense Refill  . butalbital-acetaminophen-caffeine (FIORICET, ESGIC) 50-325-40 MG per tablet Take 1-2 tablets by mouth every 6 (six) hours as needed. Patient can take 1-2 tablets every 4-6 hrs PRN with a max dose of 6 tablets per 24 hour period     No current facility-administered medications on file prior to visit.     Review of Systems:  As per HPI, otherwise negative.    Physical Examination: Filed Vitals:   07/03/14 0905  BP: 112/68  Pulse: 95  Temp: 98 F (36.7 C)  Resp: 16   Filed Vitals:   07/03/14 0905  Height: 5\' 7"  (1.702 m)  Weight: 197 lb 6.4 oz (89.54 kg)   Body mass index is 30.91 kg/(m^2). Ideal Body Weight: Weight in (lb) to have BMI = 25: 159.3   GEN: WDWN, NAD, Non-toxic, Alert & Oriented x 3 HEENT: Atraumatic, Normocephalic.  Ears and Nose: No external deformity. EXTR: No clubbing/cyanosis/edema NEURO: Normal gait.  PSYCH: Normally interactive. Conversant. Not depressed or anxious appearing.  Calm demeanor.  BACK:  Tender lumbosacral region Neuro intact  Assessment and Plan: Lumbosacral strain Anaprox Flexeril tyl #3  Signed,  Phillips OdorJeffery Seab Axel, MD

## 2014-07-03 NOTE — Patient Instructions (Signed)

## 2019-05-09 ENCOUNTER — Other Ambulatory Visit: Payer: Self-pay

## 2019-05-09 DIAGNOSIS — Z20822 Contact with and (suspected) exposure to covid-19: Secondary | ICD-10-CM

## 2019-05-11 LAB — NOVEL CORONAVIRUS, NAA: SARS-CoV-2, NAA: DETECTED — AB

## 2019-05-15 ENCOUNTER — Other Ambulatory Visit: Payer: Self-pay

## 2019-05-15 DIAGNOSIS — Z20822 Contact with and (suspected) exposure to covid-19: Secondary | ICD-10-CM

## 2019-05-16 LAB — NOVEL CORONAVIRUS, NAA: SARS-CoV-2, NAA: DETECTED — AB

## 2019-05-19 ENCOUNTER — Other Ambulatory Visit: Payer: Self-pay

## 2019-05-19 DIAGNOSIS — Z20822 Contact with and (suspected) exposure to covid-19: Secondary | ICD-10-CM

## 2019-05-20 LAB — NOVEL CORONAVIRUS, NAA: SARS-CoV-2, NAA: NOT DETECTED

## 2021-08-01 ENCOUNTER — Other Ambulatory Visit: Payer: Self-pay

## 2021-08-01 ENCOUNTER — Encounter (HOSPITAL_BASED_OUTPATIENT_CLINIC_OR_DEPARTMENT_OTHER): Payer: Self-pay | Admitting: Obstetrics and Gynecology

## 2021-08-01 ENCOUNTER — Emergency Department (HOSPITAL_BASED_OUTPATIENT_CLINIC_OR_DEPARTMENT_OTHER)
Admission: EM | Admit: 2021-08-01 | Discharge: 2021-08-01 | Disposition: A | Discharge status: Home / Self Care | Payer: No Typology Code available for payment source | Attending: Emergency Medicine | Admitting: Emergency Medicine

## 2021-08-01 ENCOUNTER — Emergency Department (HOSPITAL_BASED_OUTPATIENT_CLINIC_OR_DEPARTMENT_OTHER): Payer: No Typology Code available for payment source | Admitting: Radiology

## 2021-08-01 DIAGNOSIS — S52135A Nondisplaced fracture of neck of left radius, initial encounter for closed fracture: Secondary | ICD-10-CM

## 2021-08-01 DIAGNOSIS — R030 Elevated blood-pressure reading, without diagnosis of hypertension: Secondary | ICD-10-CM

## 2021-08-01 DIAGNOSIS — S5002XA Contusion of left elbow, initial encounter: Secondary | ICD-10-CM | POA: Insufficient documentation

## 2021-08-01 DIAGNOSIS — W000XXA Fall on same level due to ice and snow, initial encounter: Secondary | ICD-10-CM | POA: Diagnosis not present

## 2021-08-01 DIAGNOSIS — W010XXA Fall on same level from slipping, tripping and stumbling without subsequent striking against object, initial encounter: Secondary | ICD-10-CM

## 2021-08-01 MED ORDER — IBUPROFEN 400 MG PO TABS: 400.0000 mg | ORAL_TABLET | Freq: Once | ORAL | Status: DC

## 2021-08-01 MED ORDER — OXYCODONE-ACETAMINOPHEN 5-325 MG PO TABS: 1.0000 | ORAL_TABLET | Freq: Four times a day (QID) | ORAL | 0 refills | Status: AC | PRN

## 2021-08-01 MED ORDER — OXYCODONE-ACETAMINOPHEN 5-325 MG PO TABS
1.0000 | ORAL_TABLET | ORAL | Status: DC | PRN
  Administered 2021-08-01: 15:00:00 1 via ORAL
  Filled 2021-08-01: qty 1

## 2021-08-01 NOTE — Discharge Instructions (Addendum)
It was our pleasure to provide your ER care today - we hope that you feel better.  Wear splint, keep it clean/dry. Elevate arm. Icepack to sore area.   Take motrin or aleve as need for pain. You may also take percocet as need for pain. No driving for the next 6 hours or when taking percocet. Also, do not take tylenol or acetaminophen containing medication when taking percocet.   Follow up with orthopedist in the next 2-3 days - call office today or tomorrow AM to arrange appointment.   Your blood pressure is high today-  follow up with primary care doctor in the next 1-2 weeks for recheck.   Return to ER if worse, new symptoms, new/severe or intractable pain, numbness/weakness, or other concern.

## 2021-08-01 NOTE — ED Provider Notes (Addendum)
[facility] EMERGENCY DEPT Provider Note   CSN: [id number] Arrival date & time: 08/01/21  1431     History Chief Complaint  Patient presents with   Shannon Estrada         Shannon Estrada is a 40 y.o. female.  Pt c/o slip and fall on ice yesterday, with contusion to left elbow area - symptoms acute onset, moderate, constant, dull, persistent, worse w movement. Skin intact. No shoulder or wrist pain. No numbness/weakness. Slipped on ice. No faintness or dizziness. No loc or headache. No neck or back pain. Denies any other pain or injury. Right hand dom.   The history is provided by medical records and the patient.  Fall Pertinent negatives include no chest pain, no abdominal pain and no headaches.      Past Medical History:  Diagnosis Date   PCOS (polycystic ovarian syndrome)    Pregnancy induced hypertension    Wound dehiscence     Patient Active Problem List   Diagnosis Date Noted   Postpartum care following cesarean delivery (6/21) 01/26/2012    Past Surgical History:  Procedure Laterality Date   CESAREAN SECTION     CESAREAN SECTION  01/26/2012   Procedure: CESAREAN SECTION;  Surgeon: Lenoard Aden, MD;  Location: [facility];  Service: Gynecology;  Laterality: N/A;  EDD: 02/02/12     OB History     Gravida  2   Para  2   Term  2   Preterm      AB      Living  2      SAB      IAB      Ectopic      Multiple      Live Births  1           Family History  Problem Relation Age of Onset   Heart disease Father     Social History   Tobacco Use   Smoking status: Never   Smokeless tobacco: Never  Substance Use Topics   Alcohol use: No   Drug use: No    Home Medications Prior to Admission medications   Medication Sig Start Date End Date Taking? Authorizing Provider  acetaminophen-codeine (TYLENOL #3) 300-30 MG per tablet Take 1-2 tablets by mouth every 4 (four) hours as needed. 07/03/14   Carmelina Dane, MD   butalbital-acetaminophen-caffeine (FIORICET, ESGIC) [phone] MG per tablet Take 1-2 tablets by mouth every 6 (six) hours as needed. Patient can take 1-2 tablets every 4-6 hrs PRN with a max dose of 6 tablets per 24 hour period    [provider]  cyclobenzaprine (FLEXERIL) 10 MG tablet Take 1 tablet (10 mg total) by mouth 3 (three) times daily as needed for muscle spasms. 07/03/14   Carmelina Dane, MD    Allergies    Patient has no known allergies.  Review of Systems   Review of Systems  Constitutional:  Negative for fever.  Cardiovascular:  Negative for chest pain.  Gastrointestinal:  Negative for abdominal pain.  Musculoskeletal:  Negative for back pain and neck pain.  Skin:  Negative for wound.  Neurological:  Negative for weakness, numbness and headaches.   Physical Exam Updated Vital Signs BP (!) 179/120    Pulse 78    Temp 98 F (36.7 C)    Resp 14    Ht 1.702 m (5\' 7" )    Wt 108.9 kg    SpO2 96%    BMI 37.59 kg/m  Physical Exam Vitals and nursing note reviewed.  Constitutional:      Appearance: Normal appearance. She is well-developed.  HENT:     Head: Atraumatic.     Nose: Nose normal.     Mouth/Throat:     Mouth: Mucous membranes are moist.  Eyes:     General: No scleral icterus.    Conjunctiva/sclera: Conjunctivae normal.     Pupils: Pupils are equal, round, and reactive to light.  Neck:     Trachea: No tracheal deviation.  Cardiovascular:     Rate and Rhythm: Normal rate.     Pulses: Normal pulses.  Pulmonary:     Effort: Pulmonary effort is normal. No respiratory distress.  Abdominal:     General: There is no distension.  Genitourinary:    Comments: No cva tenderness.  Musculoskeletal:        General: No swelling.     Cervical back: Normal range of motion and neck supple. No rigidity. No muscular tenderness.     Comments: CTLS spine, non tender, aligned, no step off. Tenderness left elbow. Compartments of arm and forearm and soft, not  tense, no severe swelling. Radial pulse 2+.  Skin:    General: Skin is warm and dry.     Findings: No rash.  Neurological:     Mental Status: She is alert.     Comments: Alert, speech normal. Motor/sens grossly intact. LUE nvi.   Psychiatric:        Mood and Affect: Mood normal.    ED Results / Procedures / Treatments   Labs (all labs ordered are listed, but only abnormal results are displayed) Labs Reviewed - No data to display  EKG None  Radiology DG Elbow Complete Left  Result Date: 08/01/2021 CLINICAL DATA:  Larey Seat on ice yesterday, LEFT elbow and forearm pain EXAM: LEFT ELBOW - COMPLETE 3+ VIEW COMPARISON:  None FINDINGS: Osseous mineralization normal. Joint spaces preserved. Joint effusion present. Probable subtle impacted LEFT radial neck fracture. No additional fracture, dislocation, or bone destruction. IMPRESSION: LEFT elbow joint effusion with probable subtle impacted LEFT radial neck fracture. Electronically Signed   By: Ulyses Southward M.D.   On: 08/01/2021 15:41   DG Forearm Left  Result Date: 08/01/2021 CLINICAL DATA:  Larey Seat on ice yesterday, LEFT elbow and forearm pain EXAM: LEFT FOREARM - 2 VIEW COMPARISON:  None FINDINGS: Osseous mineralization normal. Joint spaces preserved. Joint effusion present LEFT elbow. Questionable subtle impacted LEFT radial neck fracture. No additional fracture, dislocation, or bone destruction. IMPRESSION: RADIAL NECK FRACTURE.: IMPRESSION: RADIAL NECK FRACTURE. Small joint effusion with question impacted LEFT Electronically Signed   By: Ulyses Southward M.D.   On: 08/01/2021 15:39    Procedures Procedures   Medications Ordered in ED Medications  oxyCODONE-acetaminophen (PERCOCET/ROXICET) 5-325 MG per tablet 1 tablet (1 tablet Oral Given 08/01/21 1506)  ibuprofen (ADVIL) tablet 400 mg (has no administration in time range)    ED Course  I have reviewed the triage vital signs and the nursing notes.  Pertinent labs & imaging results that were  available during my care of the patient were reviewed by me and considered in my medical decision making (see chart for details).    MDM Rules/Calculators/A&P                         Xrays.  Reviewed nursing notes and prior charts for additional history.   Xrays reviewed/interpreted by me - left rad neck fx.  Discussed xr w pt.  Splint applied.   Icepack.   Percocet 1 po. Pain controlled. Pt has ride.   Ortho f/u.  Recheck bp, improved from prior. No headache, no cp or sob, no edema.     Final Clinical Impression(s) / ED Diagnoses Final diagnoses:  None    Rx / DC Orders ED Discharge Orders     None          Cathren Laine, MD 08/01/21 [phone]

## 2021-08-01 NOTE — ED Triage Notes (Signed)
Patient reports to the ER for left arm pain after a fall on ice yesterday.

## 2022-01-20 ENCOUNTER — Encounter (HOSPITAL_BASED_OUTPATIENT_CLINIC_OR_DEPARTMENT_OTHER): Payer: Self-pay

## 2022-01-20 ENCOUNTER — Emergency Department (HOSPITAL_BASED_OUTPATIENT_CLINIC_OR_DEPARTMENT_OTHER): Payer: PRIVATE HEALTH INSURANCE

## 2022-01-20 ENCOUNTER — Emergency Department (HOSPITAL_BASED_OUTPATIENT_CLINIC_OR_DEPARTMENT_OTHER)
Admission: EM | Admit: 2022-01-20 | Discharge: 2022-01-20 | Disposition: A | Discharge status: Home / Self Care | Payer: PRIVATE HEALTH INSURANCE | Attending: Emergency Medicine | Admitting: Emergency Medicine

## 2022-01-20 ENCOUNTER — Other Ambulatory Visit: Payer: Self-pay

## 2022-01-20 ENCOUNTER — Other Ambulatory Visit (HOSPITAL_BASED_OUTPATIENT_CLINIC_OR_DEPARTMENT_OTHER): Payer: Self-pay

## 2022-01-20 DIAGNOSIS — S20461A Insect bite (nonvenomous) of right back wall of thorax, initial encounter: Secondary | ICD-10-CM | POA: Insufficient documentation

## 2022-01-20 DIAGNOSIS — J181 Lobar pneumonia, unspecified organism: Secondary | ICD-10-CM | POA: Diagnosis not present

## 2022-01-20 DIAGNOSIS — E871 Hypo-osmolality and hyponatremia: Secondary | ICD-10-CM | POA: Insufficient documentation

## 2022-01-20 DIAGNOSIS — E876 Hypokalemia: Secondary | ICD-10-CM | POA: Insufficient documentation

## 2022-01-20 DIAGNOSIS — D72829 Elevated white blood cell count, unspecified: Secondary | ICD-10-CM | POA: Diagnosis not present

## 2022-01-20 DIAGNOSIS — J189 Pneumonia, unspecified organism: Secondary | ICD-10-CM

## 2022-01-20 DIAGNOSIS — W57XXXA Bitten or stung by nonvenomous insect and other nonvenomous arthropods, initial encounter: Secondary | ICD-10-CM | POA: Diagnosis not present

## 2022-01-20 DIAGNOSIS — R000 Tachycardia, unspecified: Secondary | ICD-10-CM | POA: Insufficient documentation

## 2022-01-20 DIAGNOSIS — R509 Fever, unspecified: Secondary | ICD-10-CM

## 2022-01-20 DIAGNOSIS — S20469A Insect bite (nonvenomous) of unspecified back wall of thorax, initial encounter: Secondary | ICD-10-CM | POA: Diagnosis present

## 2022-01-20 LAB — URINALYSIS, ROUTINE W REFLEX MICROSCOPIC
Bilirubin Urine: NEGATIVE
Glucose, UA: NEGATIVE mg/dL
Ketones, ur: NEGATIVE mg/dL
Leukocytes,Ua: NEGATIVE
Nitrite: NEGATIVE
Specific Gravity, Urine: 1.018 (ref 1.005–1.030)
pH: 6.5 (ref 5.0–8.0)

## 2022-01-20 LAB — CBC WITH DIFFERENTIAL/PLATELET
Abs Immature Granulocytes: 0.07 10*3/uL (ref 0.00–0.07)
Basophils Absolute: 0.1 10*3/uL (ref 0.0–0.1)
Basophils Relative: 1 %
Eosinophils Absolute: 0 10*3/uL (ref 0.0–0.5)
Eosinophils Relative: 0 %
HCT: 43.5 % (ref 36.0–46.0)
Hemoglobin: 14.9 g/dL (ref 12.0–15.0)
Immature Granulocytes: 1 %
Lymphocytes Relative: 13 %
Lymphs Abs: 1.6 10*3/uL (ref 0.7–4.0)
MCH: 32 pg (ref 26.0–34.0)
MCHC: 34.3 g/dL (ref 30.0–36.0)
MCV: 93.3 fL (ref 80.0–100.0)
Monocytes Absolute: 1.1 10*3/uL — ABNORMAL HIGH (ref 0.1–1.0)
Monocytes Relative: 8 %
Neutro Abs: 9.7 10*3/uL — ABNORMAL HIGH (ref 1.7–7.7)
Neutrophils Relative %: 77 %
Platelets: 214 10*3/uL (ref 150–400)
RBC: 4.66 MIL/uL (ref 3.87–5.11)
RDW: 12 % (ref 11.5–15.5)
WBC: 12.5 10*3/uL — ABNORMAL HIGH (ref 4.0–10.5)
nRBC: 0 % (ref 0.0–0.2)

## 2022-01-20 LAB — HCG, SERUM, QUALITATIVE: Preg, Serum: NEGATIVE

## 2022-01-20 LAB — LACTIC ACID, PLASMA: Lactic Acid, Venous: 1.5 mmol/L (ref 0.5–1.9)

## 2022-01-20 LAB — APTT: aPTT: 37 seconds — ABNORMAL HIGH (ref 24–36)

## 2022-01-20 LAB — COMPREHENSIVE METABOLIC PANEL
ALT: 57 U/L — ABNORMAL HIGH (ref 0–44)
AST: 50 U/L — ABNORMAL HIGH (ref 15–41)
Albumin: 4 g/dL (ref 3.5–5.0)
Alkaline Phosphatase: 62 U/L (ref 38–126)
Anion gap: 10 (ref 5–15)
BUN: 5 mg/dL — ABNORMAL LOW (ref 6–20)
CO2: 25 mmol/L (ref 22–32)
Calcium: 9.7 mg/dL (ref 8.9–10.3)
Chloride: 93 mmol/L — ABNORMAL LOW (ref 98–111)
Creatinine, Ser: 0.84 mg/dL (ref 0.44–1.00)
GFR, Estimated: >60 mL/min (ref >60)
Glucose, Bld: 132 mg/dL — ABNORMAL HIGH (ref 70–99)
Potassium: 3.4 mmol/L — ABNORMAL LOW (ref 3.5–5.1)
Sodium: 128 mmol/L — ABNORMAL LOW (ref 135–145)
Total Bilirubin: 1.2 mg/dL (ref 0.3–1.2)
Total Protein: 7.6 g/dL (ref 6.5–8.1)

## 2022-01-20 LAB — PROTIME-INR
INR: 1.2 (ref 0.8–1.2)
Prothrombin Time: 15 seconds (ref 11.4–15.2)

## 2022-01-20 MED ORDER — LACTATED RINGERS IV BOLUS
1000.0000 mL | Freq: Once | INTRAVENOUS | Status: AC
  Administered 2022-01-20: 1000 mL via INTRAVENOUS

## 2022-01-20 MED ORDER — SODIUM CHLORIDE 0.9 % IV SOLN
500.0000 mg | Freq: Once | INTRAVENOUS | Status: AC
  Administered 2022-01-20: 500 mg via INTRAVENOUS
  Filled 2022-01-20: qty 5

## 2022-01-20 MED ORDER — DOXYCYCLINE HYCLATE 100 MG PO CAPS
100.0000 mg | ORAL_CAPSULE | Freq: Two times a day (BID) | ORAL | 0 refills | Status: AC
  Filled 2022-01-20: qty 20, 10d supply, fill #0

## 2022-01-20 MED ORDER — SODIUM CHLORIDE 0.9 % IV BOLUS
1000.0000 mL | Freq: Once | INTRAVENOUS | Status: AC
  Administered 2022-01-20: 1000 mL via INTRAVENOUS

## 2022-01-20 MED ORDER — SODIUM CHLORIDE 0.9 % IV SOLN
2.0000 g | Freq: Once | INTRAVENOUS | Status: AC
  Administered 2022-01-20: 2 g via INTRAVENOUS
  Filled 2022-01-20: qty 20

## 2022-01-20 MED ORDER — ACETAMINOPHEN 325 MG PO TABS
650.0000 mg | ORAL_TABLET | Freq: Once | ORAL | Status: AC
  Administered 2022-01-20: 650 mg via ORAL
  Filled 2022-01-20: qty 2

## 2022-01-20 NOTE — Discharge Instructions (Signed)
Please drink Gatorade mixed half-and-half with water for oral hydration to make sure that you urinate at least once every 2 hours Take antibiotics as prescribed Eat potassium rich foods Call your doctor and have recheck of your sodium Monday Return if you are having worsening symptoms especially shortness of breath increased weakness or other new symptoms

## 2022-01-20 NOTE — ED Provider Notes (Signed)
[facility] EMERGENCY DEPT Provider Note   CSN: [id number] Arrival date & time: 01/20/22  0719     History  Chief Complaint  Patient presents with   Fever    Shannon Estrada is a 41 y.o. female.  HPI  41 year old female presents today complaining of fever, body aches, poor appetite for the past 5 days.  She reports fever up to 102 at home.  She was seen in urgent care yesterday and had COVID test that was negative.  She reports that she has had multiple COVID and flu test have remained negative.  She has not had any nasal congestion.  She has had some coughing but feels that this is secondary to dry throat.  She has had poor appetite but not vomiting or diarrhea.  She is taking in p.o. fluids.  She has had some increased frequency of urination but is not having pain.  She states that she thinks this may be due to her IUD which is due to come out next month and that maybe her body is rejecting this.  She is not having any vaginal discharge or lower abdominal pain.  She denies any rashes.  She had a tick bite last week and was on her for 2 days.  They state that it was a small tick and there is a small red area in her right upper back that they have been treating with alcohol.  There is no other rash and she has not had any particular pain from this.  She has had no known sick contacts.    Home Medications Prior to Admission medications   Medication Sig Start Date End Date Taking? Authorizing Provider  doxycycline (VIBRAMYCIN) 100 MG capsule Take 1 capsule (100 mg total) by mouth 2 (two) times daily. 01/20/22  Yes Margarita Grizzle, MD  acetaminophen-codeine (TYLENOL #3) 300-30 MG per tablet Take 1-2 tablets by mouth every 4 (four) hours as needed. 07/03/14   Carmelina Dane, MD  butalbital-acetaminophen-caffeine (FIORICET, ESGIC) [phone] MG per tablet Take 1-2 tablets by mouth every 6 (six) hours as needed. Patient can take 1-2 tablets every 4-6 hrs PRN with a max dose of 6  tablets per 24 hour period    [provider]  cyclobenzaprine (FLEXERIL) 10 MG tablet Take 1 tablet (10 mg total) by mouth 3 (three) times daily as needed for muscle spasms. 07/03/14   Carmelina Dane, MD  oxyCODONE-acetaminophen (PERCOCET/ROXICET) 5-325 MG tablet Take 1-2 tablets by mouth every 6 (six) hours as needed for severe pain. 08/01/21   Cathren Laine, MD      Allergies    Patient has no known allergies.    Review of Systems   Review of Systems  Physical Exam Updated Vital Signs BP 126/77   Pulse 91   Temp (!) 100.6 F (38.1 C) (Oral)   Resp (!) 23   Ht 1.702 m (5\' 7" )   Wt 109.8 kg   SpO2 92%   BMI 37.90 kg/m  Physical Exam Vitals and nursing note reviewed.  Constitutional:      General: She is not in acute distress.    Appearance: Normal appearance. She is not ill-appearing.  HENT:     Head: Normocephalic.     Right Ear: Tympanic membrane normal.     Left Ear: Tympanic membrane normal.     Nose: Nose normal.     Mouth/Throat:     Mouth: Mucous membranes are moist.     Pharynx: Oropharynx is clear.  Eyes:     Pupils: Pupils are equal, round, and reactive to light.  Cardiovascular:     Rate and Rhythm: Regular rhythm. Tachycardia present.     Pulses: Normal pulses.     Heart sounds: Normal heart sounds.  Pulmonary:     Effort: Pulmonary effort is normal.     Breath sounds: Normal breath sounds.  Abdominal:     General: Bowel sounds are normal.     Palpations: Abdomen is soft.     Tenderness: There is no abdominal tenderness. There is no right CVA tenderness or left CVA tenderness.  Genitourinary:    Comments: No CVA tenderness Musculoskeletal:        General: Normal range of motion.     Cervical back: Normal range of motion.  Skin:    General: Skin is warm and dry.     Capillary Refill: Capillary refill takes less than 2 seconds.     Findings: Lesion present. No erythema or rash.     Comments: Small less than 1 cm area in her right  upper back that has a central area consistent with prior tick bite with mild erythema, no target lesion, no induration or fluctuance  Neurological:     General: No focal deficit present.     Mental Status: She is alert.  Psychiatric:        Mood and Affect: Mood normal.     ED Results / Procedures / Treatments   Labs (all labs ordered are listed, but only abnormal results are displayed) Labs Reviewed  COMPREHENSIVE METABOLIC PANEL - Abnormal; Notable for the following components:      Result Value   Sodium 128 (*)    Potassium 3.4 (*)    Chloride 93 (*)    Glucose, Bld 132 (*)    BUN 5 (*)    AST 50 (*)    ALT 57 (*)    All other components within normal limits  CBC WITH DIFFERENTIAL/PLATELET - Abnormal; Notable for the following components:   WBC 12.5 (*)    Neutro Abs 9.7 (*)    Monocytes Absolute 1.1 (*)    All other components within normal limits  APTT - Abnormal; Notable for the following components:   aPTT 37 (*)    All other components within normal limits  URINALYSIS, ROUTINE W REFLEX MICROSCOPIC - Abnormal; Notable for the following components:   Hgb urine dipstick SMALL (*)    Protein, ur TRACE (*)    Bacteria, UA RARE (*)    All other components within normal limits  CULTURE, BLOOD (ROUTINE X 2)  CULTURE, BLOOD (ROUTINE X 2)  URINE CULTURE  LACTIC ACID, PLASMA  PROTIME-INR  HCG, SERUM, QUALITATIVE  LYME DISEASE DNA BY PCR(BORRELIA BURG)  LYME DISEASE SEROLOGY W/REFLEX  ROCKY MTN SPOTTED FVR ABS PNL(IGG+IGM)  LACTIC ACID, PLASMA    EKG EKG Interpretation  Date/Time:  Friday January 20 2022 08:28:49 EDT Ventricular Rate:  110 PR Interval:  85 QRS Duration: 79 QT Interval:  301 QTC Calculation: 408 R Axis:   40 Text Interpretation: Sinus tachycardia duplicate credit Confirmed by Margarita Grizzle [phone]) on 01/20/2022 10:18:38 AM  Radiology DG Chest Port 1 View  Result Date: 01/20/2022 CLINICAL DATA:  Sepsis evaluation, fever EXAM: PORTABLE CHEST 1 VIEW  COMPARISON:  None Available. FINDINGS: Ill-defined increased right lower lung opacity compatible with right basilar pneumonia. No large effusion or pneumothorax. Normal heart size and vascularity. Left lung remains clear. Trachea midline. No acute osseous finding.  IMPRESSION: Right basilar airspace process concerning for pneumonia. Electronically Signed   By: Judie Petit.  Shick M.D.   On: 01/20/2022 08:16    Procedures Procedures    Medications Ordered in ED Medications  lactated ringers bolus 1,000 mL (0 mLs Intravenous Stopped 01/20/22 0949)  cefTRIAXone (ROCEPHIN) 2 g in sodium chloride 0.9 % 100 mL IVPB (0 g Intravenous Stopped 01/20/22 1030)  azithromycin (ZITHROMAX) 500 mg in sodium chloride 0.9 % 250 mL IVPB (0 mg Intravenous Stopped 01/20/22 1100)  lactated ringers bolus 1,000 mL (0 mLs Intravenous Stopped 01/20/22 1130)  acetaminophen (TYLENOL) tablet 650 mg (650 mg Oral Given 01/20/22 0947)  sodium chloride 0.9 % bolus 1,000 mL (0 mLs Intravenous Stopped 01/20/22 1207)    ED Course/ Medical Decision Making/ A&P Clinical Course as of 01/20/22 1323  Fri Jan 20, 2022  1317 Comprehensive metabolic panel(!) Complete metabolic panel reviewed interpreted and hyponatremia noted.  Mildly elevated liver enzymes at 1557 mild hypokalemia with potassium 3.4. [DR]  1318 CBC with Differential(!) BC reviewed and interpreted and leukocytosis of 12,500 noted [DR]  1318 Past x-Morell Mears reviewed interpreted and significant for right basilar airspace process consistent with pneumonia [DR]  1318 Urinalysis, Routine w reflex microscopic Urine, Clean Catch(!) Clean-catch urine reviewed and interpreted and likely contaminated with squamous epithelial cells present [DR]    Clinical Course User Index [DR] Margarita Grizzle, MD                           Medical Decision Making 41 year old female presents today with fever and body aches.  Differential diagnosis is broad and not limited to viral infection, bacterial infection,  including infections of the lungs, skin, urinary tract, and tickborne illnesses. Patient is being evaluated with complete blood cell count, urinalysis, lactic acid, Rocky [place] spotted fever antibodies, Lyme serology, Here on work-up patient has chest x-Benedict Kue can consistent with pneumonia.  She has been coughing but has been nonproductive.  She is not dyspneic. Patient received IV fluids.  She is hyponatremic with sodium of 128.  She received normal saline as some of her fluids. She is also mildly hypokalemic and is to orally replete her potassium She received IV Rocephin and Zithromax.  We will place her on doxycycline as an outpatient which should cover both community-acquired acquired pneumonia and tickborne illnesses. She is aware that her tickborne illness labs are pending.  These will need to be reviewed as an outpatient and the decision made whether or not to continue doxycycline. Lactic acid was obtained and normal at 1.5 patient's blood pressure and heart rate have remained stable.  Amount and/or Complexity of Data Reviewed Labs: ordered. Decision-making details documented in ED Course. Radiology: ordered and independent interpretation performed. Decision-making details documented in ED Course. ECG/medicine tests: ordered.  Risk OTC drugs.           Final Clinical Impression(s) / ED Diagnoses Final diagnoses:  Febrile illness, acute  Community acquired pneumonia of right lower lobe of lung  Tick bite of thoracic wall, unspecified whether front or back, initial encounter  Hyponatremia    Rx / DC Orders ED Discharge Orders          Ordered    doxycycline (VIBRAMYCIN) 100 MG capsule  2 times daily        01/20/22 1322              Margarita Grizzle, MD 01/20/22 1323

## 2022-01-20 NOTE — ED Triage Notes (Signed)
"  Fever, body aches and no appetite x 5 days. Was seen at urgent care yesterday and tested for covid and flu and it was negative and I was discharged, but I am still feeling horrible" per pt

## 2022-01-21 LAB — URINE CULTURE: Culture: NO GROWTH

## 2022-01-22 LAB — ROCKY MTN SPOTTED FVR ABS PNL(IGG+IGM)
RMSF IgG: NEGATIVE
RMSF IgM: 0.36 index (ref 0.00–0.89)

## 2022-01-23 LAB — LYME DISEASE SEROLOGY W/REFLEX: Lyme Total Antibody EIA: NEGATIVE

## 2022-01-25 LAB — CULTURE, BLOOD (ROUTINE X 2)
Culture: NO GROWTH
Culture: NO GROWTH
Special Requests: ADEQUATE

## 2022-01-25 LAB — LYME DISEASE DNA BY PCR(BORRELIA BURG): Lyme Disease(B.burgdorferi)PCR: NEGATIVE

## 2022-12-29 IMAGING — DX DG ELBOW COMPLETE 3+V*L*
4 series · 4 of 4 positions shown · non-contrast
Comparison: None

CLINICAL DATA: Fell on ice yesterday, LEFT elbow and forearm pain

EXAM:
LEFT ELBOW - COMPLETE 3+ VIEW

[elbow obl (1 of 2)]
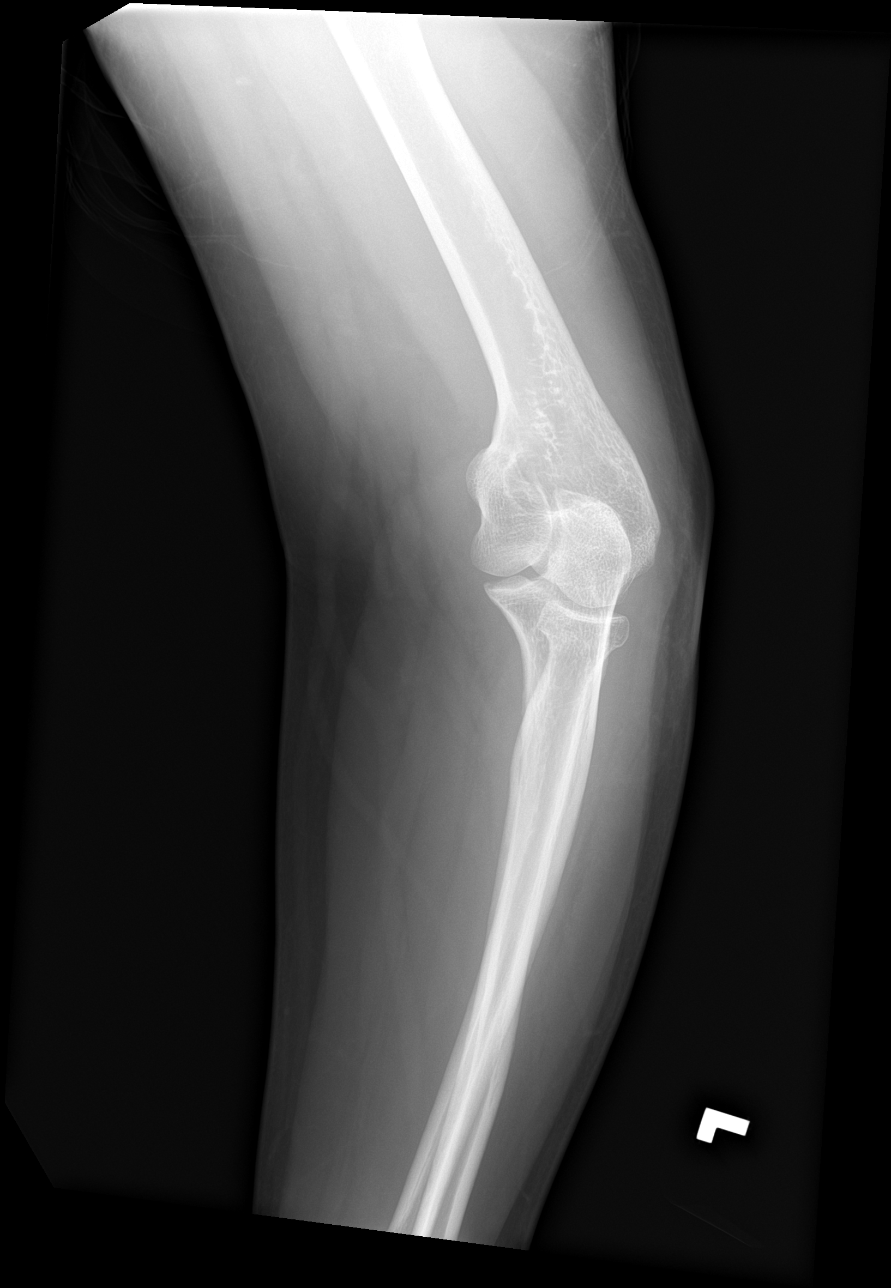

[elbow ap]
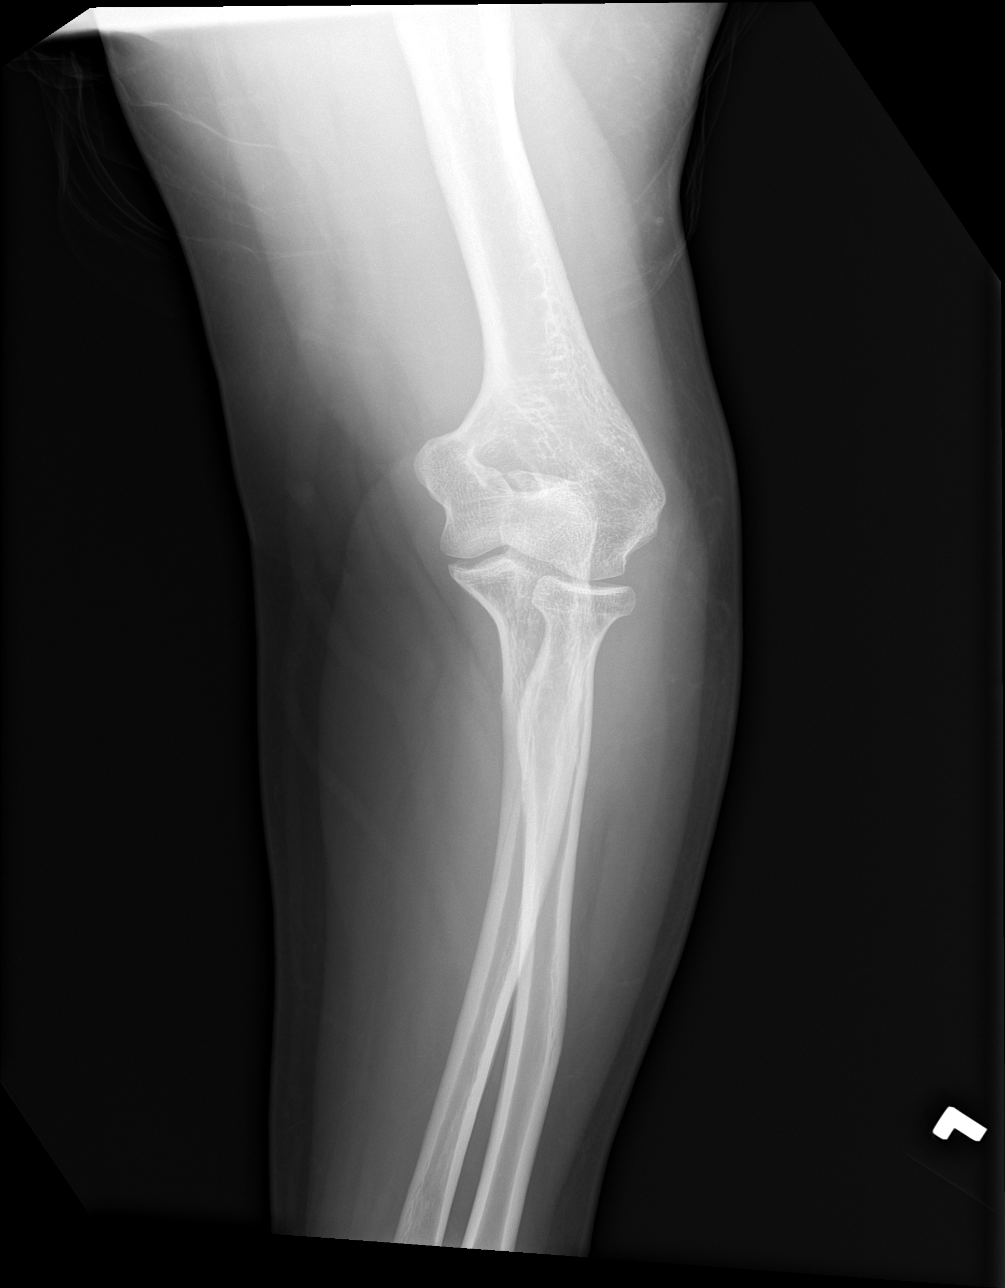

[elbow obl (2 of 2)]
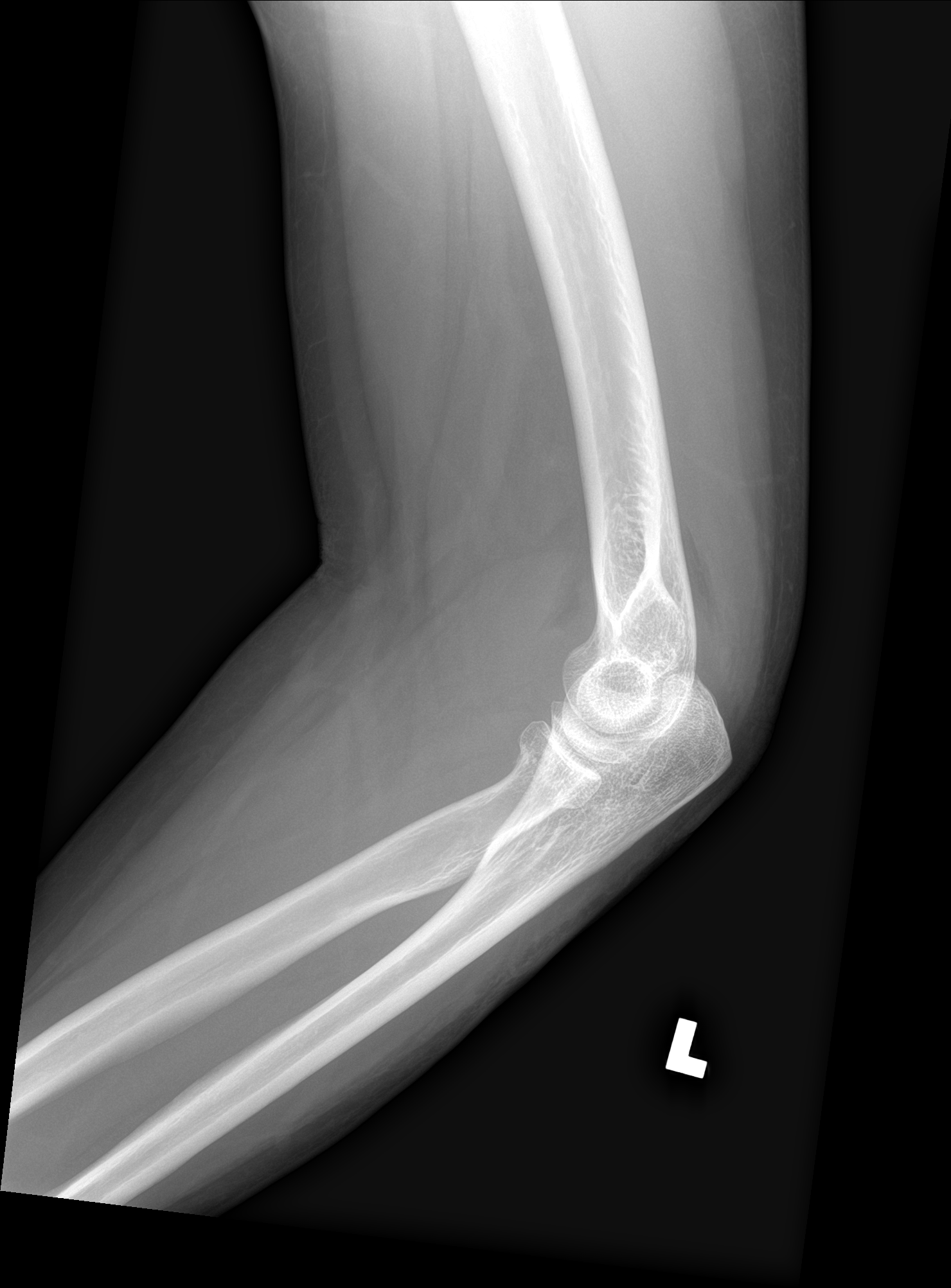

[elbow lat]
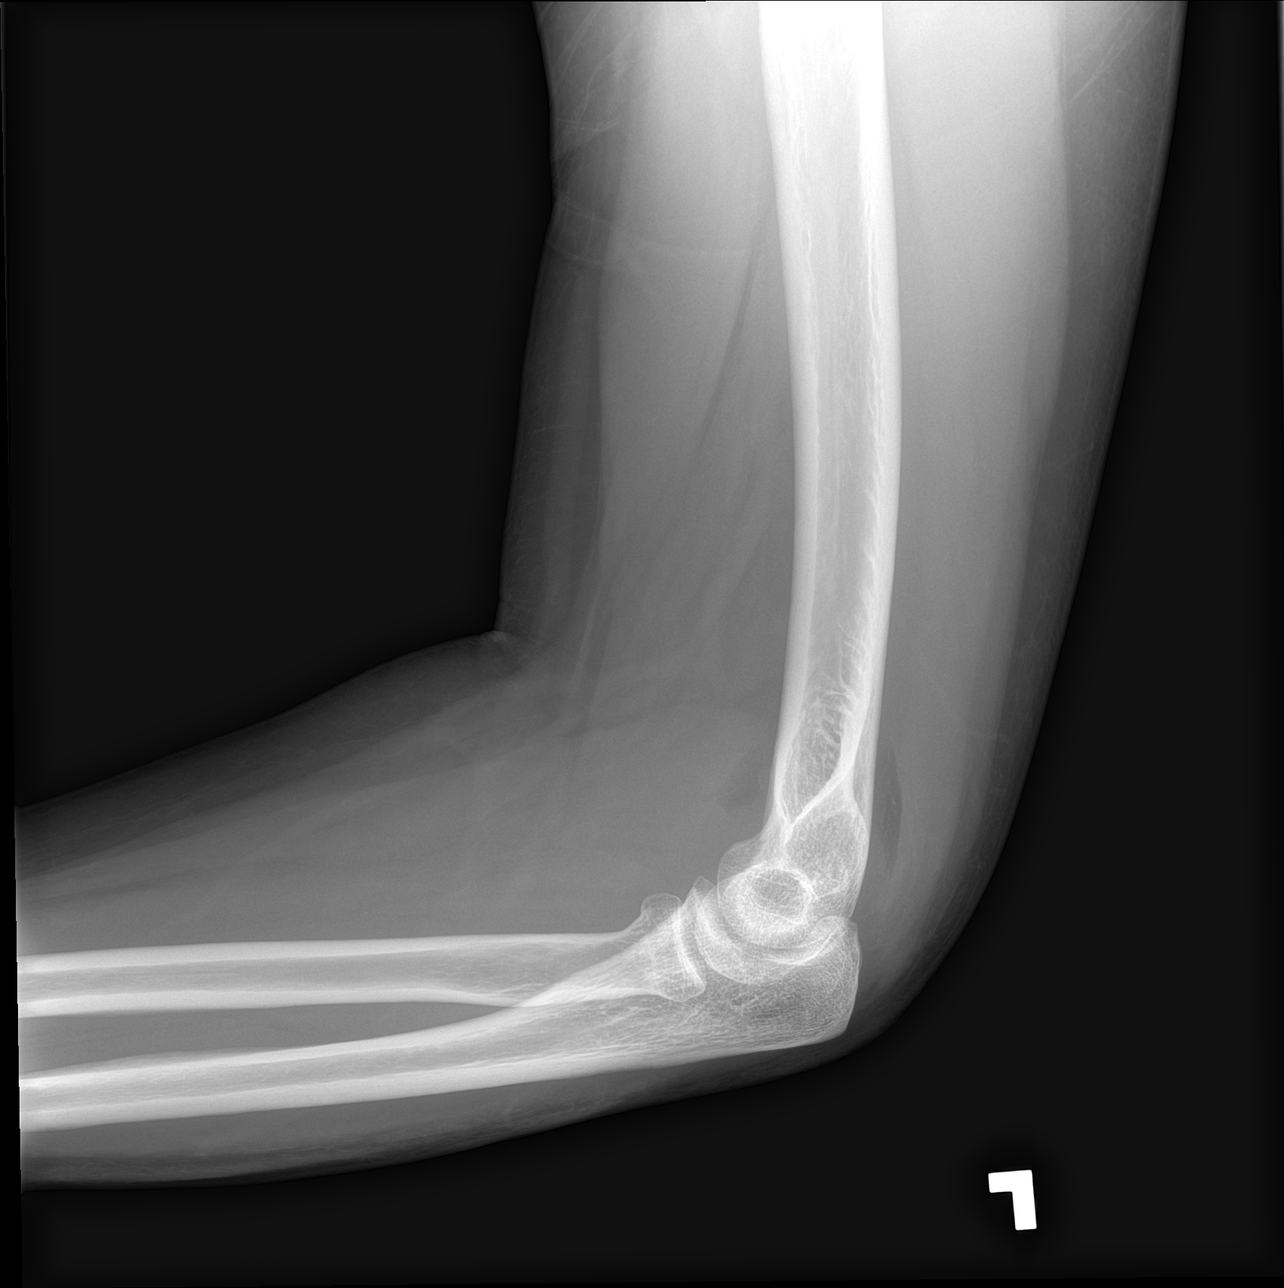

[4 of 4 positions shown; findings below may reference images not displayed]

FINDINGS: Osseous mineralization normal.

Joint spaces preserved.

Joint effusion present.

Probable subtle impacted LEFT radial neck fracture.

No additional fracture, dislocation, or bone destruction.
IMPRESSION: LEFT elbow joint effusion with probable subtle impacted LEFT radial
neck fracture.

## 2023-05-31 DIAGNOSIS — R748 Abnormal levels of other serum enzymes: Secondary | ICD-10-CM | POA: Diagnosis not present

## 2023-05-31 DIAGNOSIS — R030 Elevated blood-pressure reading, without diagnosis of hypertension: Secondary | ICD-10-CM | POA: Diagnosis not present

## 2023-05-31 DIAGNOSIS — E282 Polycystic ovarian syndrome: Secondary | ICD-10-CM | POA: Diagnosis not present

## 2023-05-31 DIAGNOSIS — F900 Attention-deficit hyperactivity disorder, predominantly inattentive type: Secondary | ICD-10-CM | POA: Diagnosis not present

## 2023-05-31 DIAGNOSIS — Z0000 Encounter for general adult medical examination without abnormal findings: Secondary | ICD-10-CM | POA: Diagnosis not present

## 2023-05-31 DIAGNOSIS — R7303 Prediabetes: Secondary | ICD-10-CM | POA: Diagnosis not present

## 2023-05-31 DIAGNOSIS — Z13220 Encounter for screening for lipoid disorders: Secondary | ICD-10-CM | POA: Diagnosis not present

## 2023-06-19 IMAGING — DX DG CHEST 1V PORT
1 series · 1 of 1 positions shown · non-contrast
Comparison: None Available.

CLINICAL DATA: Sepsis evaluation, fever

EXAM:
PORTABLE CHEST 1 VIEW

[chest ap]
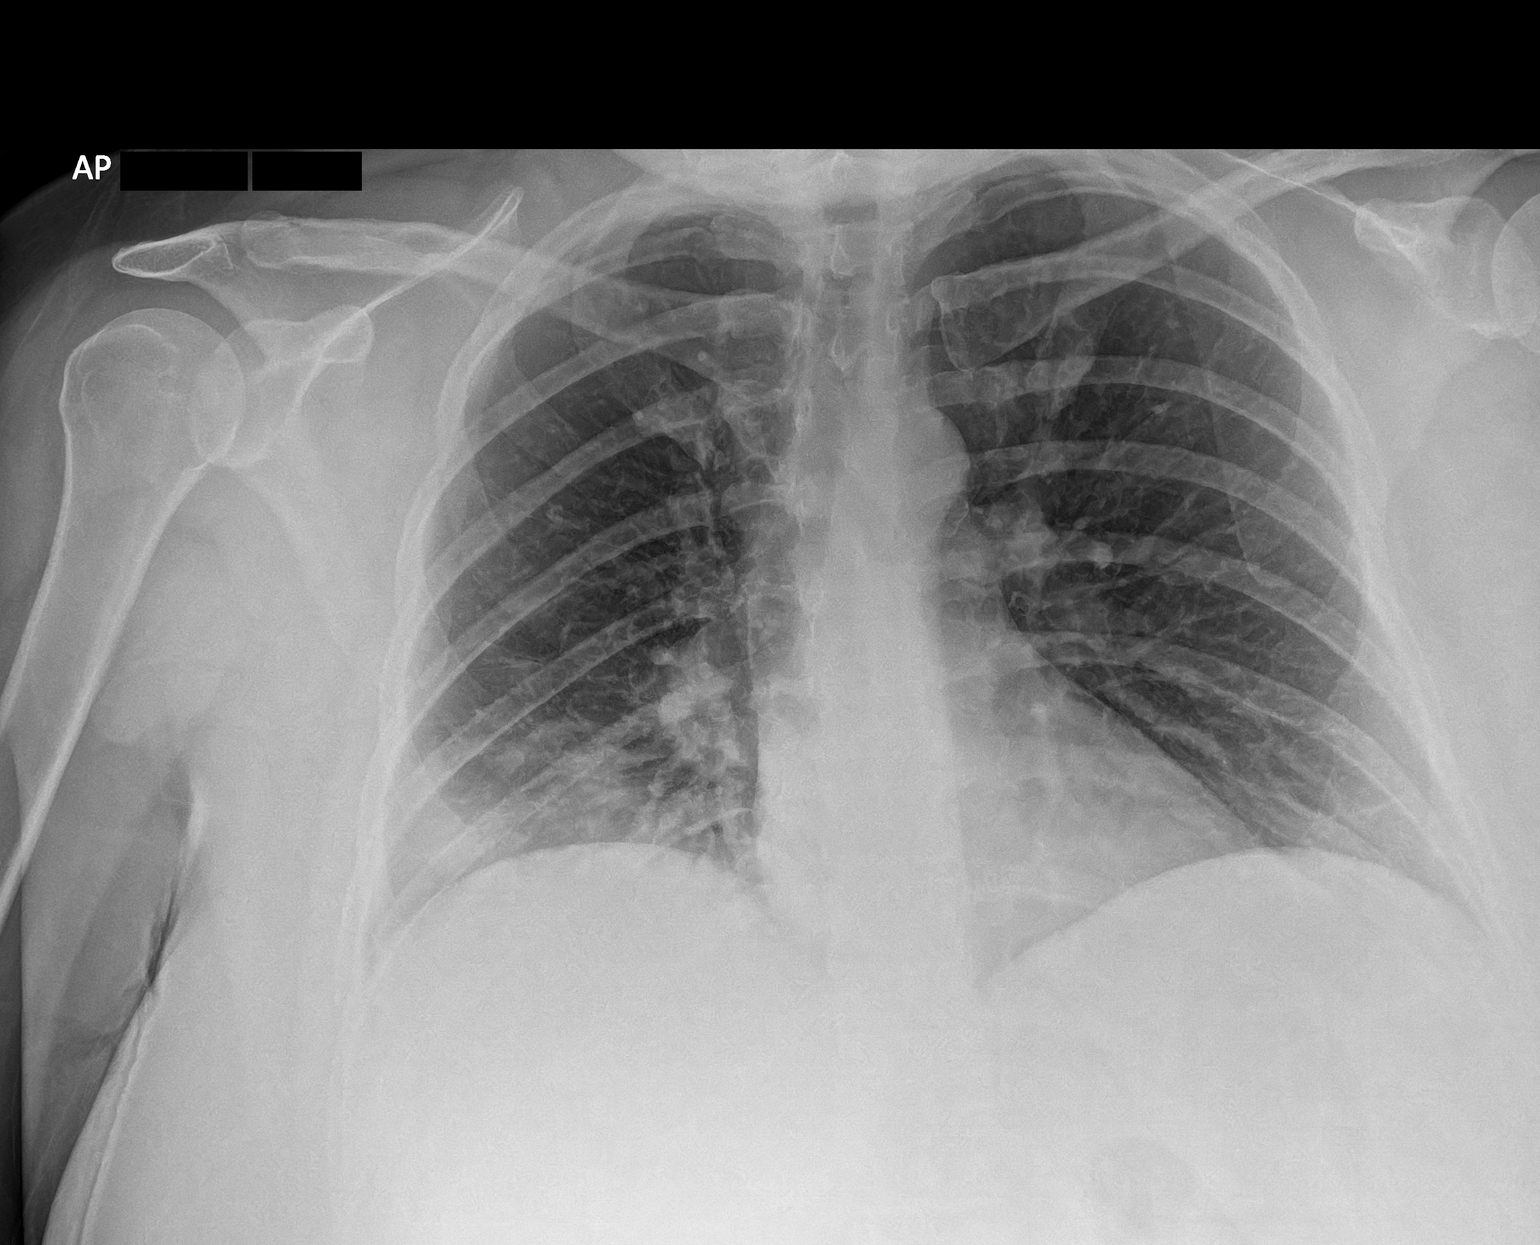

[1 of 1 positions shown; findings below may reference images not displayed]

FINDINGS: Ill-defined increased right lower lung opacity compatible with right
basilar pneumonia. No large effusion or pneumothorax. Normal heart
size and vascularity. Left lung remains clear. Trachea midline. No
acute osseous finding.
IMPRESSION: Right basilar airspace process concerning for pneumonia.

## 2023-11-09 DIAGNOSIS — Z6839 Body mass index (BMI) 39.0-39.9, adult: Secondary | ICD-10-CM | POA: Diagnosis not present

## 2023-11-09 DIAGNOSIS — J101 Influenza due to other identified influenza virus with other respiratory manifestations: Secondary | ICD-10-CM | POA: Diagnosis not present

## 2023-11-09 DIAGNOSIS — Z20828 Contact with and (suspected) exposure to other viral communicable diseases: Secondary | ICD-10-CM | POA: Diagnosis not present
